# Patient Record
Sex: Female | Born: 1965
Health system: Southern US, Community
[De-identification: ages and names within clinical notes are randomized; demographics above are authoritative.]

## PROBLEM LIST (undated history)

## (undated) DIAGNOSIS — R51 Headache: Secondary | ICD-10-CM

## (undated) DIAGNOSIS — F32A Depression, unspecified: Secondary | ICD-10-CM

## (undated) DIAGNOSIS — F329 Major depressive disorder, single episode, unspecified: Secondary | ICD-10-CM

## (undated) DIAGNOSIS — N920 Excessive and frequent menstruation with regular cycle: Secondary | ICD-10-CM

## (undated) DIAGNOSIS — F419 Anxiety disorder, unspecified: Secondary | ICD-10-CM

## (undated) DIAGNOSIS — D649 Anemia, unspecified: Secondary | ICD-10-CM

## (undated) HISTORY — DX: Excessive and frequent menstruation with regular cycle: N92.0

---

## 1998-03-24 ENCOUNTER — Other Ambulatory Visit: Admission: RE | Admit: 1998-03-24 | Discharge: 1998-03-24 | Payer: Self-pay | Admitting: Obstetrics and Gynecology

## 1998-08-08 ENCOUNTER — Ambulatory Visit (HOSPITAL_COMMUNITY): Admission: RE | Admit: 1998-08-08 | Discharge: 1998-08-08 | Payer: Self-pay | Admitting: Obstetrics and Gynecology

## 1999-05-23 ENCOUNTER — Other Ambulatory Visit: Admission: RE | Admit: 1999-05-23 | Discharge: 1999-05-23 | Payer: Self-pay | Admitting: Obstetrics and Gynecology

## 1999-11-08 ENCOUNTER — Inpatient Hospital Stay (HOSPITAL_COMMUNITY): Admission: AD | Admit: 1999-11-08 | Discharge: 1999-11-10 | Payer: Self-pay | Admitting: Obstetrics and Gynecology

## 1999-11-16 ENCOUNTER — Encounter: Admission: RE | Admit: 1999-11-16 | Discharge: 1999-12-07 | Payer: Self-pay | Admitting: Obstetrics and Gynecology

## 1999-12-25 ENCOUNTER — Other Ambulatory Visit: Admission: RE | Admit: 1999-12-25 | Discharge: 1999-12-25 | Payer: Self-pay | Admitting: Obstetrics and Gynecology

## 2001-02-11 ENCOUNTER — Other Ambulatory Visit: Admission: RE | Admit: 2001-02-11 | Discharge: 2001-02-11 | Payer: Self-pay | Admitting: Obstetrics and Gynecology

## 2001-02-25 ENCOUNTER — Encounter: Admission: RE | Admit: 2001-02-25 | Discharge: 2001-02-25 | Payer: Self-pay | Admitting: Obstetrics and Gynecology

## 2001-02-25 ENCOUNTER — Encounter: Payer: Self-pay | Admitting: Obstetrics and Gynecology

## 2002-03-31 ENCOUNTER — Other Ambulatory Visit: Admission: RE | Admit: 2002-03-31 | Discharge: 2002-03-31 | Payer: Self-pay | Admitting: Obstetrics and Gynecology

## 2002-05-12 ENCOUNTER — Encounter: Payer: Self-pay | Admitting: Obstetrics and Gynecology

## 2002-05-12 ENCOUNTER — Encounter: Admission: RE | Admit: 2002-05-12 | Discharge: 2002-05-12 | Payer: Self-pay | Admitting: Obstetrics and Gynecology

## 2003-05-05 ENCOUNTER — Other Ambulatory Visit: Admission: RE | Admit: 2003-05-05 | Discharge: 2003-05-05 | Payer: Self-pay | Admitting: Obstetrics and Gynecology

## 2003-06-22 ENCOUNTER — Encounter: Admission: RE | Admit: 2003-06-22 | Discharge: 2003-06-22 | Payer: Self-pay | Admitting: Obstetrics and Gynecology

## 2004-08-22 ENCOUNTER — Encounter: Admission: RE | Admit: 2004-08-22 | Discharge: 2004-08-22 | Payer: Self-pay | Admitting: Obstetrics and Gynecology

## 2005-08-26 ENCOUNTER — Encounter: Admission: RE | Admit: 2005-08-26 | Discharge: 2005-08-26 | Payer: Self-pay | Admitting: Obstetrics and Gynecology

## 2006-05-14 ENCOUNTER — Other Ambulatory Visit: Admission: RE | Admit: 2006-05-14 | Discharge: 2006-05-14 | Payer: Self-pay | Admitting: Family Medicine

## 2006-09-11 ENCOUNTER — Encounter: Admission: RE | Admit: 2006-09-11 | Discharge: 2006-09-11 | Payer: Self-pay | Admitting: Family Medicine

## 2007-11-11 ENCOUNTER — Encounter: Admission: RE | Admit: 2007-11-11 | Discharge: 2007-11-11 | Payer: Self-pay | Admitting: Obstetrics and Gynecology

## 2008-11-11 ENCOUNTER — Encounter: Admission: RE | Admit: 2008-11-11 | Discharge: 2008-11-11 | Payer: Self-pay | Admitting: Family Medicine

## 2008-11-21 ENCOUNTER — Encounter: Admission: RE | Admit: 2008-11-21 | Discharge: 2008-11-21 | Payer: Self-pay | Admitting: Family Medicine

## 2009-11-27 ENCOUNTER — Encounter: Admission: RE | Admit: 2009-11-27 | Discharge: 2009-11-27 | Payer: Self-pay | Admitting: Family Medicine

## 2010-05-27 ENCOUNTER — Encounter: Payer: Self-pay | Admitting: Family Medicine

## 2010-09-21 NOTE — H&P (Signed)
[facility]  Patient:    Debra Caldwell, Debra Caldwell                   MRN: [id number] Attending:  Lenoard Aden, M.D.                         History and Physical  CHIEF COMPLAINT:              Favorable cervix at term, history of second trimester pregnancy loss.  HISTORY OF PRESENT ILLNESS:   Patient is a 45 year old white female, G7, P2-0-4-2, EDD November 19, 1999, at 38+ weeks, with advanced cervical dilatation and history of pregnancy loss.  ALLERGIES:                    No known drug allergies.  MEDICATIONS:                  Prenatal vitamins.  PAST MEDICAL HISTORY:         History of TAB in 1984, SAB in 1994, full-term vaginal delivery in 1995, SAB in 1996, full-term delivery in 1997 and 20-week pregnancy loss, unexplained, in 2000.  She has had fractured right wrist from a fall and wisdom tooth extraction previously.  No other medical or surgical hospitalizations.  FAMILY HISTORY:               Family history of cardiovascular disease, hypertension and thyroid disease.  PREGNANCY HISTORY:            Remarkable for normal prenatal lab data and a GBS that was negative.  PHYSICAL EXAMINATION:  GENERAL:                      She is a well-developed, well-nourished white female in no apparent distress.  HEENT:                        Normal.  LUNGS:                        Clear.  HEART:                        Regular rate and rhythm.  ABDOMEN:                      Soft, gravid, nontender.  Estimated fetal weight is 7 pounds.  PELVIC:                       Cervix is 4 cm, 50%, vertex -1.  EXTREMITIES:                  No cords.  NEUROLOGIC:                   Nonfocal.  IMPRESSION:                   1. Term intrauterine pregnancy.                               2. History of second trimester pregnancy loss.                               3. Grade 3 placenta.  PLAN:                         Admit.  Artificial rupture of membranes. Pitocin.   Anticipate vaginal delivery. DD:  11/08/99 TD:  11/08/99 Job: [id number]

## 2010-10-22 ENCOUNTER — Other Ambulatory Visit: Payer: Self-pay | Admitting: Family Medicine

## 2010-10-22 DIAGNOSIS — Z1231 Encounter for screening mammogram for malignant neoplasm of breast: Secondary | ICD-10-CM

## 2010-11-30 ENCOUNTER — Ambulatory Visit
Admission: RE | Admit: 2010-11-30 | Discharge: 2010-11-30 | Disposition: A | Discharge status: Home / Self Care | Payer: BC Managed Care – PPO | Source: Ambulatory Visit | Attending: Family Medicine | Admitting: Family Medicine

## 2010-11-30 DIAGNOSIS — Z1231 Encounter for screening mammogram for malignant neoplasm of breast: Secondary | ICD-10-CM

## 2011-11-04 ENCOUNTER — Other Ambulatory Visit: Payer: Self-pay | Admitting: Family Medicine

## 2011-11-04 DIAGNOSIS — Z1231 Encounter for screening mammogram for malignant neoplasm of breast: Secondary | ICD-10-CM

## 2011-11-25 LAB — HM PAP SMEAR: HM Pap smear: NEGATIVE

## 2011-12-02 ENCOUNTER — Ambulatory Visit
Admission: RE | Admit: 2011-12-02 | Discharge: 2011-12-02 | Disposition: A | Discharge status: Home / Self Care | Payer: BC Managed Care – PPO | Source: Ambulatory Visit | Attending: Family Medicine | Admitting: Family Medicine

## 2011-12-02 DIAGNOSIS — Z1231 Encounter for screening mammogram for malignant neoplasm of breast: Secondary | ICD-10-CM

## 2011-12-04 ENCOUNTER — Other Ambulatory Visit: Payer: Self-pay | Admitting: Family Medicine

## 2011-12-04 DIAGNOSIS — R928 Other abnormal and inconclusive findings on diagnostic imaging of breast: Secondary | ICD-10-CM

## 2011-12-11 ENCOUNTER — Ambulatory Visit
Admission: RE | Admit: 2011-12-11 | Discharge: 2011-12-11 | Disposition: A | Discharge status: Home / Self Care | Payer: BC Managed Care – PPO | Source: Ambulatory Visit | Attending: Family Medicine | Admitting: Family Medicine

## 2011-12-11 DIAGNOSIS — R928 Other abnormal and inconclusive findings on diagnostic imaging of breast: Secondary | ICD-10-CM

## 2011-12-11 IMAGING — MG MM DIGITAL DIAGNOSTIC LIMITED*L*
2 series · 2 of 2 positions shown · non-contrast
Comparison: Multiple annual examinations dating back to [4Q].

CLINICAL DATA: The patient returns for evaluation of a possible
asymmetry in the left breast noted on recent screening study dated
[DATE].  Her grandmother and three aunts had breast cancer.

[REDACTED] MAMMOGRAM  AND LEFT BREAST
ULTRASOUND:

[L CC]
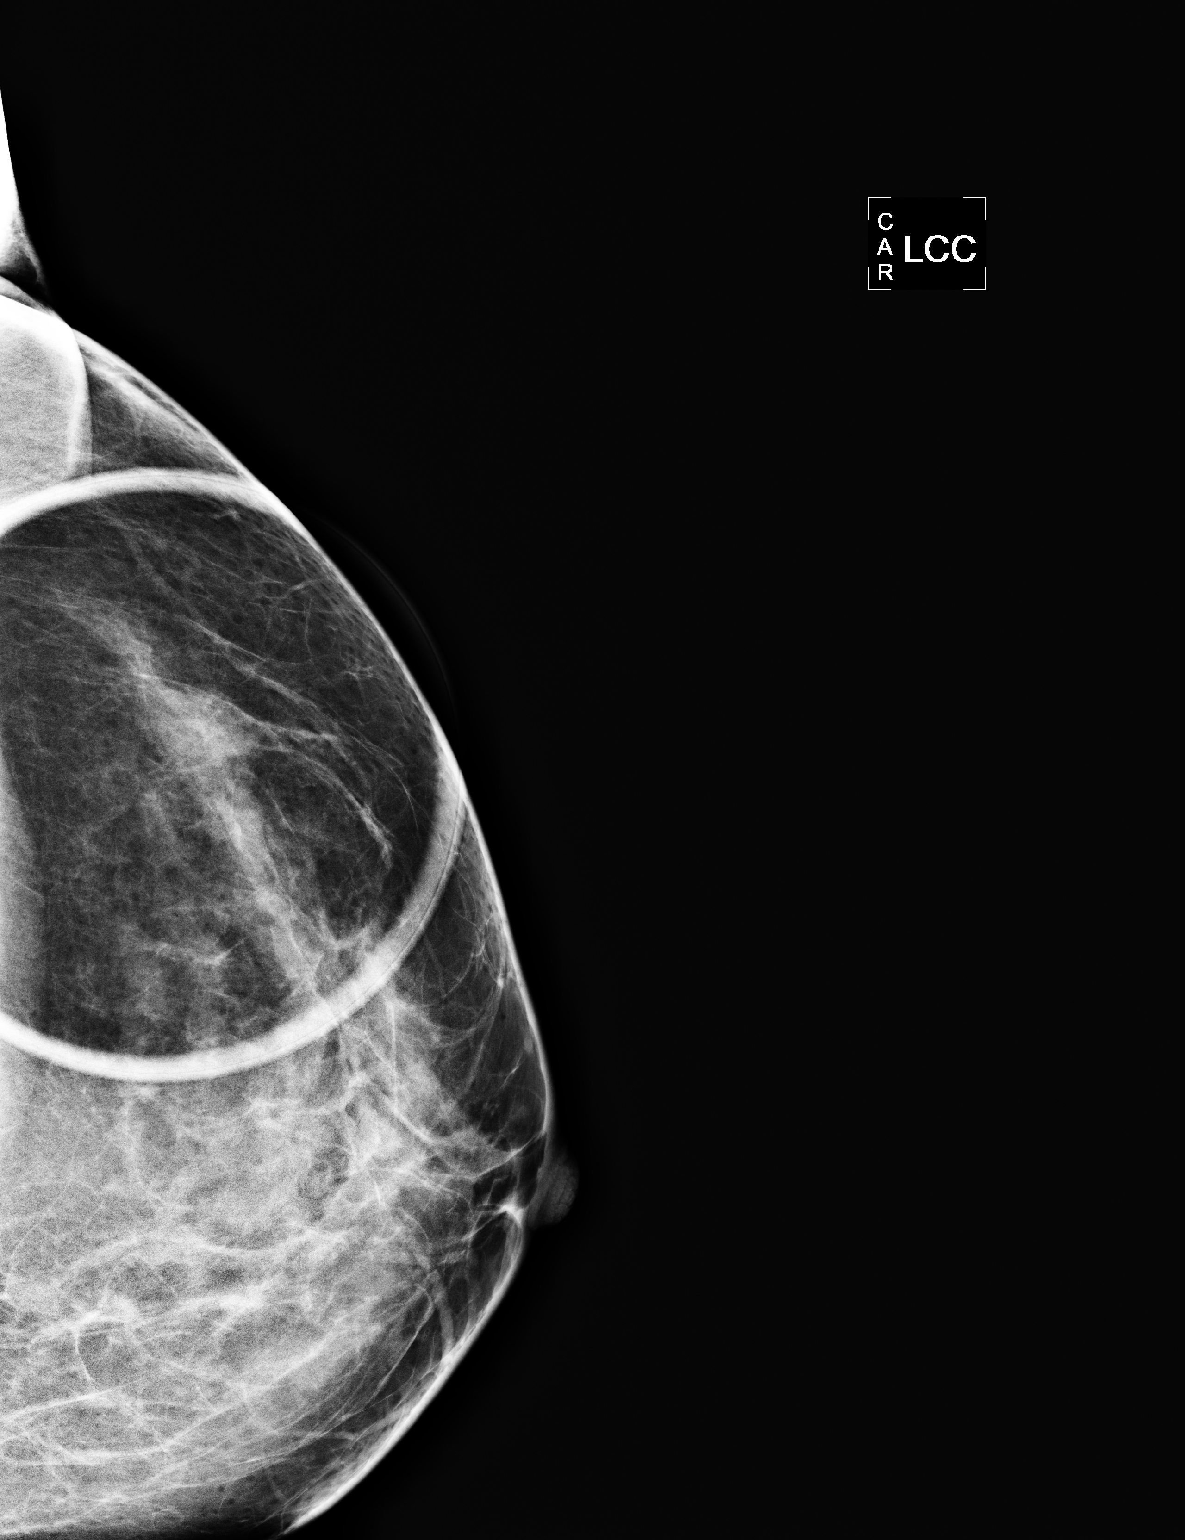

[L MLO]
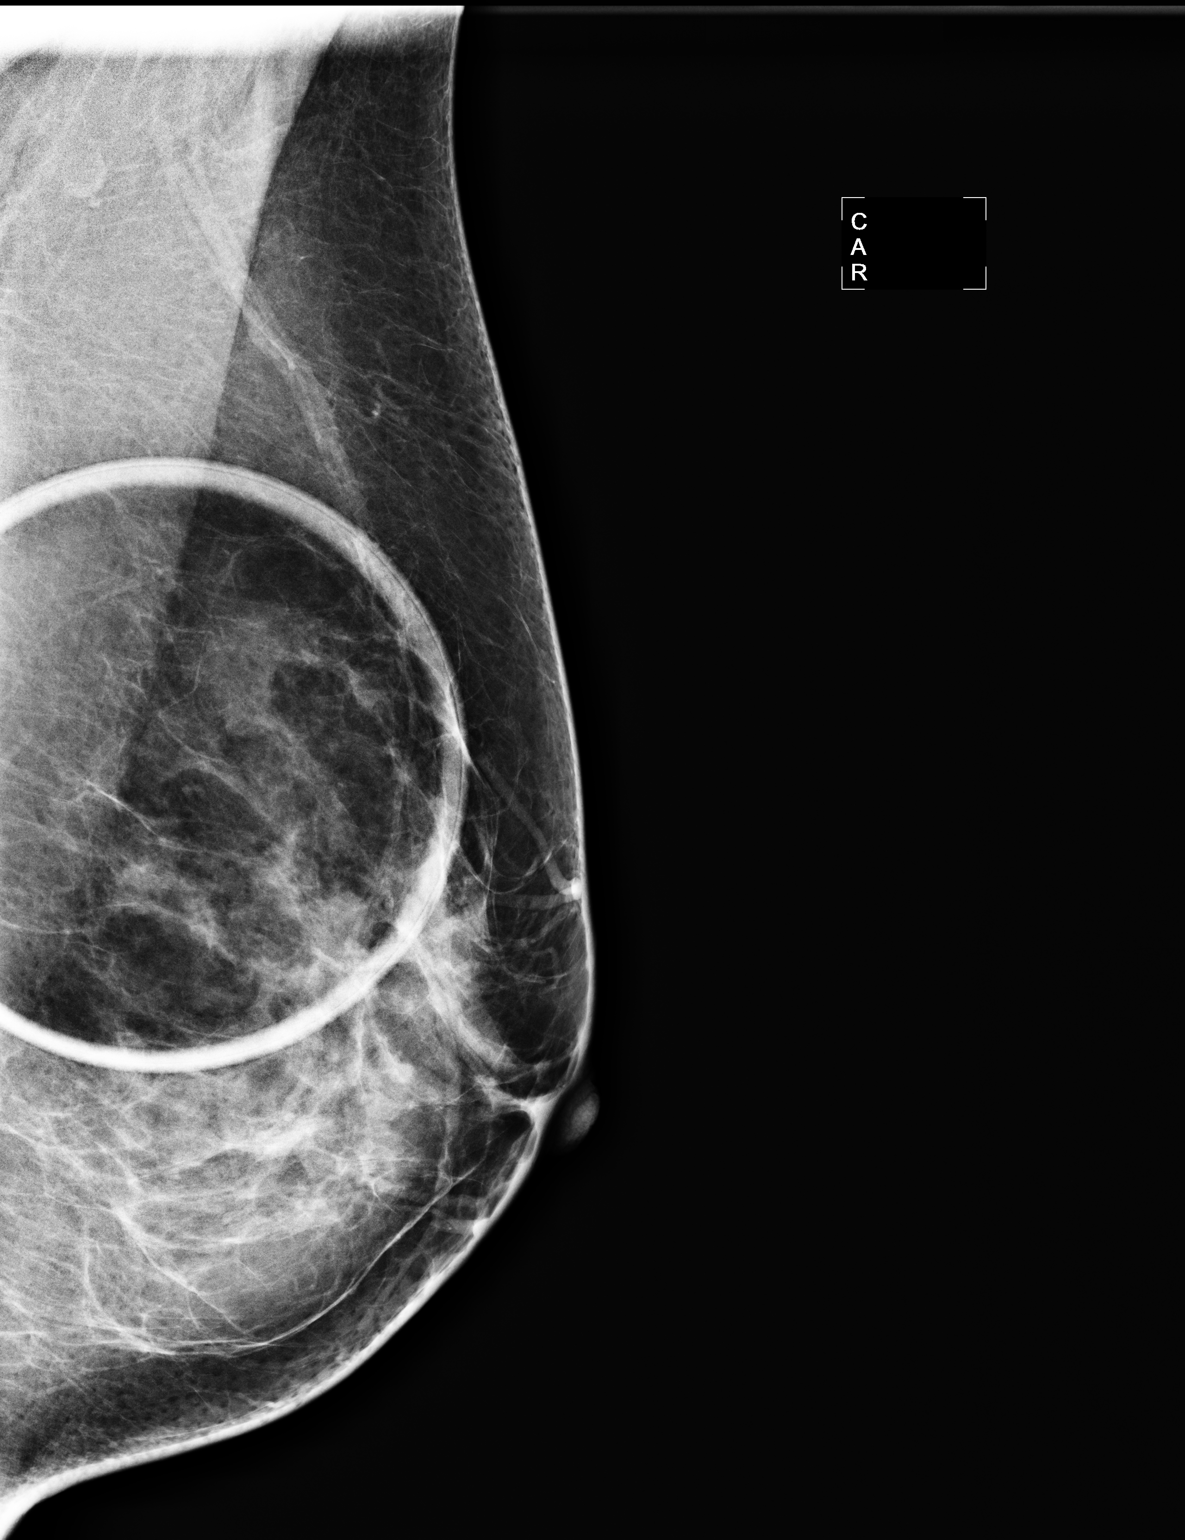

[2 of 2 positions shown; findings below may reference images not displayed]

FINDINGS: There are scattered fibroglandular densities.
Additional views demonstrate no persistent worrisome mass or
distortion in the left upper outer quadrant posteriorly.  The
parenchymal pattern is stable as compared to multiple prior
studies.

On physical exam, no mass is palpated in the outer portion of the
left breast.

Ultrasound is performed, showing mixed fibroglandular tissue and
fat in the outer portion of the left breast with no mass,
distortion or shadowing to suggest malignancy.
IMPRESSION: No persistent worrisome abnormality upon additional imaging of the
left breast.

RECOMMENDATION:
Yearly screening mammography is suggested.

BI-RADS CATEGORY 1:  Negative.

## 2012-04-13 ENCOUNTER — Encounter (HOSPITAL_COMMUNITY): Payer: Self-pay | Admitting: Pharmacist

## 2012-04-14 ENCOUNTER — Encounter (HOSPITAL_COMMUNITY): Payer: Self-pay

## 2012-04-14 ENCOUNTER — Encounter (HOSPITAL_COMMUNITY)
Admission: RE | Admit: 2012-04-14 | Discharge: 2012-04-14 | Disposition: A | Discharge status: Home / Self Care | Payer: BC Managed Care – PPO | Source: Ambulatory Visit | Attending: Obstetrics and Gynecology | Admitting: Obstetrics and Gynecology

## 2012-04-14 HISTORY — DX: Headache: R51

## 2012-04-14 HISTORY — DX: Major depressive disorder, single episode, unspecified: F32.9

## 2012-04-14 HISTORY — DX: Anxiety disorder, unspecified: F41.9

## 2012-04-14 HISTORY — DX: Depression, unspecified: F32.A

## 2012-04-14 HISTORY — DX: Anemia, unspecified: D64.9

## 2012-04-14 LAB — CBC
Hemoglobin: 12.1 g/dL (ref 12.0–15.0)
Platelets: 274 10*3/uL (ref 150–400)
RBC: 3.9 MIL/uL (ref 3.87–5.11)

## 2012-04-14 LAB — SURGICAL PCR SCREEN
MRSA, PCR: NEGATIVE
Staphylococcus aureus: POSITIVE — AB

## 2012-04-14 NOTE — Patient Instructions (Addendum)
Your procedure is scheduled on:04/21/12  Enter through the Main Entrance at :0830 am  Pick up desk phone and dial [phone] and inform us of your arrival.  Please call [phone] if you have any problems the morning of surgery.  Remember: Do not eat or drink after midnight:Monday   Take these meds the morning of surgery with a sip of water:none  DO NOT wear jewelry, eye make-up, lipstick,body lotion, or dark fingernail polish. Do not shave for 48 hours prior to surgery.  If you are to be admitted after surgery, leave suitcase in car until your room has been assigned.

## 2012-04-20 NOTE — H&P (Signed)
Debra Caldwell is an 46 y.o. female.   Chief Complaint: heavy bleeding HPI: $% yo Sep WF G7P3 with menorrhagia and anemia.  Uterus 8 x 5 x 5 cm on 12/03/11 with normal adnexa and nl SHSG.  Endo bx benign.    Past Medical History  Diagnosis Date  . Headache     migraines  . Anemia   . Anxiety   . Depression     situational    Past Surgical History  Procedure Date  . No past surgeries   D & C x 4  No family history on file. Social History:  reports that she has never smoked. She does not have any smokeless tobacco history on file. 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 No results found.  Review of Systems  All other systems reviewed and are negative.    Height 5\' 2"  (1.575 m), weight 54.432 kg (120 lb). Physical Exam  Constitutional: She is oriented to person, place, and time. She appears well-developed and well-nourished.  HENT:  Head: Normocephalic and atraumatic.  Eyes: Conjunctivae normal are normal.  Neck: Normal range of motion. Neck supple. No thyromegaly present.  Cardiovascular: Normal rate and regular rhythm.   Respiratory: Effort normal and breath sounds normal.  GI: Soft. Bowel sounds are normal. She exhibits no distension. There is no tenderness. There is no rebound.  Genitourinary: Vagina normal and uterus normal.  Musculoskeletal: Normal range of motion.  Neurological: She is alert and oriented to person, place, and time.  Skin: Skin is warm and dry.  Psychiatric: She has a normal mood and affect. Her behavior is normal. Thought content normal.     Assessment/Plan Menorrhagia, anemia.  For LAVH, bilat salpingectomy Christean Silvestri P 04/20/2012, 9:02 PM

## 2012-04-21 ENCOUNTER — Encounter (HOSPITAL_COMMUNITY): Payer: Self-pay | Admitting: Anesthesiology

## 2012-04-21 ENCOUNTER — Encounter (HOSPITAL_COMMUNITY): Payer: Self-pay

## 2012-04-21 ENCOUNTER — Ambulatory Visit (HOSPITAL_COMMUNITY): Payer: BC Managed Care – PPO | Admitting: Anesthesiology

## 2012-04-21 ENCOUNTER — Encounter (HOSPITAL_COMMUNITY)
Admission: RE | Disposition: A | Discharge status: Home / Self Care | Payer: Self-pay | Source: Ambulatory Visit | Attending: Obstetrics and Gynecology

## 2012-04-21 ENCOUNTER — Ambulatory Visit (HOSPITAL_COMMUNITY)
Admission: RE | Admit: 2012-04-21 | Discharge: 2012-04-22 | Disposition: A | Discharge status: Home / Self Care | Payer: BC Managed Care – PPO | Source: Ambulatory Visit | Attending: Obstetrics and Gynecology | Admitting: Obstetrics and Gynecology

## 2012-04-21 DIAGNOSIS — D649 Anemia, unspecified: Secondary | ICD-10-CM | POA: Insufficient documentation

## 2012-04-21 DIAGNOSIS — N920 Excessive and frequent menstruation with regular cycle: Secondary | ICD-10-CM | POA: Insufficient documentation

## 2012-04-21 HISTORY — PX: LAPAROSCOPIC ASSISTED VAGINAL HYSTERECTOMY: SHX5398

## 2012-04-21 HISTORY — PX: BILATERAL SALPINGECTOMY: SHX5743

## 2012-04-21 LAB — HCG, SERUM, QUALITATIVE: Preg, Serum: NEGATIVE

## 2012-04-21 SURGERY — HYSTERECTOMY, VAGINAL, LAPAROSCOPY-ASSISTED
Anesthesia: General | Site: Abdomen | Wound class: Clean Contaminated

## 2012-04-21 MED ORDER — HYDROMORPHONE HCL PF 1 MG/ML IJ SOLN
INTRAMUSCULAR | Status: DC | PRN
  Administered 2012-04-21: 1 mg via INTRAVENOUS

## 2012-04-21 MED ORDER — MIDAZOLAM HCL 5 MG/5ML IJ SOLN
INTRAMUSCULAR | Status: DC | PRN
  Administered 2012-04-21: 2 mg via INTRAVENOUS

## 2012-04-21 MED ORDER — METOCLOPRAMIDE HCL 5 MG/ML IJ SOLN: 10.0000 mg | Freq: Once | INTRAMUSCULAR | Status: DC | PRN

## 2012-04-21 MED ORDER — HYDROMORPHONE HCL PF 1 MG/ML IJ SOLN
INTRAMUSCULAR | Status: AC
  Filled 2012-04-21: qty 1

## 2012-04-21 MED ORDER — SODIUM CHLORIDE 0.9 % IJ SOLN
INTRAMUSCULAR | Status: DC | PRN
  Administered 2012-04-21: 10 mL

## 2012-04-21 MED ORDER — ROPIVACAINE HCL 5 MG/ML IJ SOLN
INTRAMUSCULAR | Status: DC | PRN
  Administered 2012-04-21: 30 mL

## 2012-04-21 MED ORDER — LACTATED RINGERS IV SOLN
INTRAVENOUS | Status: DC
  Administered 2012-04-21: 13:00:00 via INTRAVENOUS
  Administered 2012-04-21: 125 mL/h via INTRAVENOUS
  Administered 2012-04-21 (×3): via INTRAVENOUS

## 2012-04-21 MED ORDER — NEOSTIGMINE METHYLSULFATE 1 MG/ML IJ SOLN
INTRAMUSCULAR | Status: DC | PRN
  Administered 2012-04-21: 1 mg via INTRAVENOUS

## 2012-04-21 MED ORDER — DEXAMETHASONE SODIUM PHOSPHATE 10 MG/ML IJ SOLN
INTRAMUSCULAR | Status: AC
  Filled 2012-04-21: qty 1

## 2012-04-21 MED ORDER — FENTANYL CITRATE 0.05 MG/ML IJ SOLN
INTRAMUSCULAR | Status: DC | PRN
  Administered 2012-04-21 (×2): 50 ug via INTRAVENOUS
  Administered 2012-04-21: 100 ug via INTRAVENOUS
  Administered 2012-04-21: 50 ug via INTRAVENOUS

## 2012-04-21 MED ORDER — GLYCOPYRROLATE 0.2 MG/ML IJ SOLN
INTRAMUSCULAR | Status: DC | PRN
  Administered 2012-04-21: 0.2 mg via INTRAVENOUS

## 2012-04-21 MED ORDER — DEXAMETHASONE SODIUM PHOSPHATE 10 MG/ML IJ SOLN
INTRAMUSCULAR | Status: DC | PRN
  Administered 2012-04-21: 10 mg via INTRAVENOUS

## 2012-04-21 MED ORDER — EPHEDRINE SULFATE 50 MG/ML IJ SOLN
INTRAMUSCULAR | Status: DC | PRN
  Administered 2012-04-21 (×3): 10 mg via INTRAVENOUS

## 2012-04-21 MED ORDER — FENTANYL CITRATE 0.05 MG/ML IJ SOLN: 25.0000 ug | INTRAMUSCULAR | Status: DC | PRN

## 2012-04-21 MED ORDER — ONDANSETRON HCL 4 MG/2ML IJ SOLN
4.0000 mg | Freq: Four times a day (QID) | INTRAMUSCULAR | Status: DC | PRN
  Administered 2012-04-21: 4 mg via INTRAVENOUS
  Filled 2012-04-21: qty 2

## 2012-04-21 MED ORDER — NEOSTIGMINE METHYLSULFATE 1 MG/ML IJ SOLN
INTRAMUSCULAR | Status: AC
  Filled 2012-04-21: qty 1

## 2012-04-21 MED ORDER — OXYCODONE-ACETAMINOPHEN 5-325 MG PO TABS
1.0000 | ORAL_TABLET | ORAL | Status: DC | PRN
  Administered 2012-04-22: 2 via ORAL
  Administered 2012-04-22: 1 via ORAL
  Filled 2012-04-21 (×3): qty 1

## 2012-04-21 MED ORDER — DEXTROSE IN LACTATED RINGERS 5 % IV SOLN
INTRAVENOUS | Status: DC
  Administered 2012-04-21 – 2012-04-22 (×2): via INTRAVENOUS

## 2012-04-21 MED ORDER — EPHEDRINE 5 MG/ML INJ
INTRAVENOUS | Status: AC
  Filled 2012-04-21: qty 10

## 2012-04-21 MED ORDER — ONDANSETRON HCL 4 MG PO TABS: 4.0000 mg | ORAL_TABLET | Freq: Four times a day (QID) | ORAL | Status: DC | PRN

## 2012-04-21 MED ORDER — ROCURONIUM BROMIDE 100 MG/10ML IV SOLN
INTRAVENOUS | Status: DC | PRN
  Administered 2012-04-21: 40 mg via INTRAVENOUS
  Administered 2012-04-21: 10 mg via INTRAVENOUS

## 2012-04-21 MED ORDER — ROCURONIUM BROMIDE 50 MG/5ML IV SOLN
INTRAVENOUS | Status: AC
  Filled 2012-04-21: qty 1

## 2012-04-21 MED ORDER — LIDOCAINE HCL (CARDIAC) 20 MG/ML IV SOLN
INTRAVENOUS | Status: AC
  Filled 2012-04-21: qty 5

## 2012-04-21 MED ORDER — LIDOCAINE-EPINEPHRINE 1 %-1:100000 IJ SOLN
INTRAMUSCULAR | Status: DC | PRN
  Administered 2012-04-21: 10 mL

## 2012-04-21 MED ORDER — SCOPOLAMINE 1 MG/3DAYS TD PT72
MEDICATED_PATCH | TRANSDERMAL | Status: AC
  Administered 2012-04-21: 1.5 mg via TRANSDERMAL
  Filled 2012-04-21: qty 1

## 2012-04-21 MED ORDER — GLYCOPYRROLATE 0.2 MG/ML IJ SOLN
INTRAMUSCULAR | Status: AC
  Filled 2012-04-21: qty 1

## 2012-04-21 MED ORDER — PROPOFOL 10 MG/ML IV EMUL
INTRAVENOUS | Status: DC | PRN
  Administered 2012-04-21: 180 mg via INTRAVENOUS

## 2012-04-21 MED ORDER — PROPOFOL 10 MG/ML IV EMUL
INTRAVENOUS | Status: AC
  Filled 2012-04-21: qty 20

## 2012-04-21 MED ORDER — SCOPOLAMINE 1 MG/3DAYS TD PT72SCOPOLAMINE 1 MG/3DAYS
1.0000 | MEDICATED_PATCH | TRANSDERMAL | Status: DC
Start: 2012-04-21 — End: 2012-04-21
  Administered 2012-04-21: 1.5 mg via TRANSDERMAL

## 2012-04-21 MED ORDER — ROPIVACAINE HCL 5 MG/ML IJ SOLN
INTRAMUSCULAR | Status: AC
  Filled 2012-04-21: qty 30

## 2012-04-21 MED ORDER — LIDOCAINE HCL (CARDIAC) 20 MG/ML IV SOLN
INTRAVENOUS | Status: DC | PRN
  Administered 2012-04-21: 50 mg via INTRAVENOUS

## 2012-04-21 MED ORDER — FENTANYL CITRATE 0.05 MG/ML IJ SOLN
INTRAMUSCULAR | Status: AC
  Filled 2012-04-21: qty 5

## 2012-04-21 MED ORDER — MIDAZOLAM HCL 2 MG/2ML IJ SOLN
INTRAMUSCULAR | Status: AC
  Filled 2012-04-21: qty 2

## 2012-04-21 MED ORDER — ONDANSETRON HCL 4 MG/2ML IJ SOLN
INTRAMUSCULAR | Status: DC | PRN
  Administered 2012-04-21: 4 mg via INTRAVENOUS

## 2012-04-21 MED ORDER — MEPERIDINE HCL 25 MG/ML IJ SOLN: 6.2500 mg | INTRAMUSCULAR | Status: DC | PRN

## 2012-04-21 MED ORDER — DEXTROSE 5 % IV SOLN
2.0000 g | INTRAVENOUS | Status: AC
  Administered 2012-04-21: 2 g via INTRAVENOUS
  Filled 2012-04-21: qty 2

## 2012-04-21 MED ORDER — ONDANSETRON HCL 4 MG/2ML IJ SOLN
INTRAMUSCULAR | Status: AC
  Filled 2012-04-21: qty 2

## 2012-04-21 MED ORDER — MORPHINE SULFATE 4 MG/ML IJ SOLN
1.0000 mg | INTRAMUSCULAR | Status: DC | PRN
  Administered 2012-04-21 (×2): 2 mg via INTRAVENOUS
  Filled 2012-04-21 (×2): qty 1

## 2012-04-21 SURGICAL SUPPLY — 28 items
CABLE HIGH FREQUENCY MONO STRZ (ELECTRODE) ×3 IMPLANT
CONT PATH 16OZ SNAP LID 3702 (MISCELLANEOUS) ×3 IMPLANT
COVER TABLE BACK 60X90 (DRAPES) ×3 IMPLANT
DECANTER SPIKE VIAL GLASS SM (MISCELLANEOUS) ×6 IMPLANT
DERMABOND ADVANCED (GAUZE/BANDAGES/DRESSINGS) ×1
DERMABOND ADVANCED .7 DNX12 (GAUZE/BANDAGES/DRESSINGS) ×2 IMPLANT
ELECT REM PT RETURN 9FT ADLT (ELECTROSURGICAL) ×3
ELECTRODE REM PT RTRN 9FT ADLT (ELECTROSURGICAL) ×2 IMPLANT
EVACUATOR SMOKE 8.L (FILTER) ×3 IMPLANT
GLOVE BIOGEL PI IND STRL 7.0 (GLOVE) ×6 IMPLANT
GLOVE BIOGEL PI INDICATOR 7.0 (GLOVE) ×3
GLOVE ECLIPSE 6.5 STRL STRAW (GLOVE) ×6 IMPLANT
GOWN STRL REIN XL XLG (GOWN DISPOSABLE) ×12 IMPLANT
NS IRRIG 1000ML POUR BTL (IV SOLUTION) ×3 IMPLANT
PACK LAVH (CUSTOM PROCEDURE TRAY) ×3 IMPLANT
PROTECTOR NERVE ULNAR (MISCELLANEOUS) ×6 IMPLANT
SCISSORS LAP 5X35 DISP (ENDOMECHANICALS) ×3 IMPLANT
SET IRRIG TUBING LAPAROSCOPIC (IRRIGATION / IRRIGATOR) IMPLANT
SUT VIC AB 0 CT1 18XCR BRD8 (SUTURE) ×4 IMPLANT
SUT VIC AB 0 CT1 36 (SUTURE) ×6 IMPLANT
SUT VIC AB 0 CT1 8-18 (SUTURE) ×2
SUT VICRYL 0 UR6 27IN ABS (SUTURE) ×3 IMPLANT
TOWEL OR 17X24 6PK STRL BLUE (TOWEL DISPOSABLE) ×6 IMPLANT
TRAY FOLEY CATH 14FR (SET/KITS/TRAYS/PACK) ×3 IMPLANT
TROCAR XCEL NON-BLD 11X100MML (ENDOMECHANICALS) ×3 IMPLANT
TROCAR XCEL NON-BLD 5MMX100MML (ENDOMECHANICALS) ×6 IMPLANT
WARMER LAPAROSCOPE (MISCELLANEOUS) ×3 IMPLANT
WATER STERILE IRR 1000ML POUR (IV SOLUTION) ×3 IMPLANT

## 2012-04-21 NOTE — Op Note (Signed)
Preoperative diagnosis: Menorrhagia, anemia Postoperative diagnosis: Same, path pending Procedure: Laparoscopically-assisted total vaginal hysterectomy and bilateral salpingectomy Surgeon: Dr. Meredeth Ide assistant: Dr. Douglass Rivers anesthesia: Gen. endotracheal Estimated blood loss 100 cc Complications: None Procedure: The patient was taken to the operating room and after induction of adequate general endotracheal anesthesia was placed in the low dorsolithotomy position and prepped and draped in usual fashion.  A foley catheter was placed.  The bivalve speculum was placed and the cervix was grasped on its anterior lip single-tooth tenaculum.  A Hulka uterine manipulator was placed.  Attention was next turned to the abdomen.  A natural crease in the umbilicus was infiltrated with 1% Xylocaine incised with a knife and a varies needle was inserted into the peritoneal space.  Proper placement was noted using the hanging drop technique.  Pneumoperitoneum was created with 2 and half liters of CO2 using the automatic insufflator.  A to 11 mm Optiview trocar was then inserted.  It was found to be preperitoneal in its placement and there was some subcutaneous gas, therefore the trocar was removed, the gas was allowed to escape from the subcutaneous space.  And the varies needle was reinserted.  Again its placement was tested with the hanging drop technique.  Pneumoperitoneum was then created again with 2 and half liters of CO2.  Upon placement of the 1011 mm Optiview scope intraperitoneal placement was noted.  The abdominal wall was transilluminated, the sites for the 5 mm ports were infiltrated with 1% Xylocaine, incised with a knife, and were inserted under direct visualization the pelvis was inspected.  The uterus was of a normal size and shape.  The anterior posterior cul-de-sacs were clear.  The tubes and ovaries appeared normal.  The appendix was seen and appeared normal.  The liver and gallbladder and  stomach were also normal.  The ureters were identified and seen peristalsing bilaterally.  The procedure began on the patient's left, separating the fimbriated end of the tube from the ovary using the gyrus, and then separating mesosalpinx sequentially until the cornu was reached.  The utero-ovarian ligament was coagulated multiple times and cut.  The round ligament was coagulated and cut.  The anterior leaf of the broad ligament was taken down with monopolar scissors.  Attention was then turned to the patient's right.  The utero-ovarian ligament was coagulated multiple times and cut.  The tube was separated from the ovary its fimbriated end using the gyrus and then sequentially was separated off the mesosalpinx using gyrus.  The round ligament was coagulated and cut.  The anterior leaf of the broad ligament was taken down sharply with monopolar scissors and connected to the incision from the other side.  This was then removed from the abdomen and the procedure was begun vaginally.  A posterior weighted and anterior Deaver retractor were placed.  The cervix was grasped on its anterior lip with a single-tooth tenaculum.  The mucosa over the cervix was infiltrated with 1% Xylocaine with epinephrine.  A knife was used to incise mucosa from 9 to 3:00.  Mayo scissors were used to sharply dissect the bladder off the cervix, and then blunt dissection was also used to assist in the dissection.  Attention was then turned posteriorly.  A posterior colpotomy incision was made, and a banana retractor was placed.  The uterosacral ligaments were clamped with Heaneys cut and tied with 0 Vicryl.  One more bite was taken on each cardinal ligament on either side.  Then the attention was  turned back anteriorly.  Very gentle blunt dissection the peritoneum was entered.  A Deaver retractor was placed into the peritoneal space.  The uterine arteries on either side were then clamped with a Haney clamp cut and doubly tied.  The remainder  of the attachments of the uterus in the broad ligament were clamped cut and tied.  The specimen was then removed and sent to pathology.  The specimen was uterus cervix and bilateral tubes.  The pedicles were inspected and were free of bleeding.  The vaginal cuff was then run with a 0 Vicryl stitch from 2:00 to 10:00 incorporating the vaginal cuff and the peritoneum.  A McCall stitch was then placed bringing the uterosacral ligaments together in the midline.  Diet was then irrigated and was felt to be hemostatic.  The vaginal cuff was then closed with 4 at the suture of figure-of-eight 0 Vicryls.  Irrigation was done again.  In the vaginal portion of the procedure was terminated.  Attention was turned back abdominally.  The pneumoperitoneum was recreated and the scope was used to examine the pelvis.  The pelvis was irrigated and was felt to be hemostatic.  The pressure in the abdomen was decreased to approximately 8 mm of mercury and the pedicles were still felt to be hemostatic.  Instrument removed from the abdomen.  Pneumoperitoneum was allowed to escape.  The trocar sleeves were removed.  The fascia at the umbilical incision was closed with a single figure-of-eight suture of 0 Vicryl.  Skin at the umbilicus was closed with a subcuticular 4-0 Rapide.  All the incisions were closed with Dermabond.  The procedure was terminated.  Sponge needle and instrument counts were correct x3.  The patient was taken to the recovery room in satisfactory condition.

## 2012-04-21 NOTE — Anesthesia Postprocedure Evaluation (Signed)
Anesthesia Post Note  Patient: Debra Caldwell  Procedure(s) Performed: Procedure(s) (LRB): LAPAROSCOPIC ASSISTED VAGINAL HYSTERECTOMY (N/A) BILATERAL SALPINGECTOMY (Bilateral)  Anesthesia type: General  Patient location: PACU  Post pain: Pain level controlled  Post assessment: Post-op Vital signs reviewed  Last Vitals:  Filed Vitals:   04/21/12 1445  BP: 109/53  Pulse: 87  Temp:   Resp: 19    Post vital signs: Reviewed  Level of consciousness: sedated  Complications: No apparent anesthesia complicationsfj

## 2012-04-21 NOTE — Interval H&P Note (Signed)
History and Physical Interval Note:  04/21/2012 10:51 AM  Debra Caldwell  has presented today for surgery, with the diagnosis of menorrhagia, anemia  The various methods of treatment have been discussed with the patient and family. After consideration of risks, benefits and other options for treatment, the patient has consented to  Procedure(s) (LRB) with comments: LAPAROSCOPIC ASSISTED VAGINAL HYSTERECTOMY (N/A) as a surgical intervention .  The patient's history has been reviewed, patient examined, no change in status, stable for surgery.  I have reviewed the patient's chart and labs.  Questions were answered to the patient's satisfaction.     ROMINE,CYNTHIA P

## 2012-04-21 NOTE — Transfer of Care (Signed)
Immediate Anesthesia Transfer of Care Note  Patient: Debra Caldwell  Procedure(s) Performed: Procedure(s) (LRB) with comments: LAPAROSCOPIC ASSISTED VAGINAL HYSTERECTOMY (N/A) BILATERAL SALPINGECTOMY (Bilateral)  Patient Location: PACU  Anesthesia Type:General  Level of Consciousness: sedated  Airway & Oxygen Therapy: Patient Spontanous Breathing and Patient connected to nasal cannula oxygen  Post-op Assessment: Report given to PACU RN and Post -op Vital signs reviewed and stable  Post vital signs: Reviewed and stable  Complications: No apparent anesthesia complications

## 2012-04-21 NOTE — OR Nursing (Signed)
Went [phone]

## 2012-04-21 NOTE — Anesthesia Preprocedure Evaluation (Signed)
Anesthesia Evaluation  Patient identified by MRN, date of birth, ID band Patient awake    Reviewed: Allergy & Precautions, H&P , NPO status , Patient's Chart, lab work & pertinent test results  Airway Mallampati: III TM Distance: >3 FB Neck ROM: Full    Dental No notable dental hx. (+) Teeth Intact   Pulmonary neg pulmonary ROS,    Pulmonary exam normal       Cardiovascular negative cardio ROS  Rhythm:Regular Rate:Normal     Neuro/Psych  Headaches, PSYCHIATRIC DISORDERS Anxiety Depression    GI/Hepatic negative GI ROS, Neg liver ROS,   Endo/Other  negative endocrine ROS  Renal/GU negative Renal ROS  negative genitourinary   Musculoskeletal negative musculoskeletal ROS (+)   Abdominal Normal abdominal exam  (+)   Peds  Hematology  (+) Blood dyscrasia, anemia ,   Anesthesia Other Findings   Reproductive/Obstetrics negative OB ROS Menorrhagia                            Anesthesia Physical Anesthesia Plan  ASA: II  Anesthesia Plan: General   Post-op Pain Management:    Induction: Intravenous  Airway Management Planned: Oral ETT  Additional Equipment:   Intra-op Plan:   Post-operative Plan: Extubation in OR  Informed Consent: I have reviewed the patients History and Physical, chart, labs and discussed the procedure including the risks, benefits and alternatives for the proposed anesthesia with the patient or authorized representative who has indicated his/her understanding and acceptance.   Dental advisory given  Plan Discussed with: CRNA, Anesthesiologist and Surgeon  Anesthesia Plan Comments:         Anesthesia Quick Evaluation

## 2012-04-22 ENCOUNTER — Encounter (HOSPITAL_COMMUNITY): Payer: Self-pay | Admitting: Obstetrics and Gynecology

## 2012-04-22 NOTE — Progress Notes (Signed)
POD #1  Pt up sitting in chair, smiling and animated.  VSS, afebrile.  Reports had trouble with foley not draining well through the night and she had to stand up and hold the tubing in a certain way to get the urine to drain - otherwise she was uncomfortable with a full bladder.  This am, there is a large amount of dilute urine in the foley bag. Pain controlled well with po percocet.  No nausea.  Eager to go home.  Findings at surgery reviewed.  Exam:  VSS, afeb.  Abd soft nt.  Incisions intact.  No vag bleeding.  Extrem neg.  Ready for D/C.  Instructed.  Has rx for percocet at home and also celebrex.  Post op appt made.  Questions answered.

## 2012-04-22 NOTE — Addendum Note (Signed)
Addendum  created 04/22/12 0830 by Algis Greenhouse, CRNA   Modules edited:Notes Section

## 2012-04-22 NOTE — Progress Notes (Signed)
Patient discharged home.  Patient ambulated to car without difficulty. Patient verbalized understanding of discharge instructions

## 2012-04-22 NOTE — Anesthesia Postprocedure Evaluation (Signed)
Anesthesia Post Note  Patient: Debra Caldwell  Procedure(s) Performed: Procedure(s) (LRB): LAPAROSCOPIC ASSISTED VAGINAL HYSTERECTOMY (N/A) BILATERAL SALPINGECTOMY (Bilateral)  Anesthesia type: General  Patient location: [facility]  Post pain: Pain level controlled  Post assessment: Post-op Vital signs reviewed  Last Vitals:  Filed Vitals:   04/22/12 0630  BP: 99/49  Pulse: 77  Temp: 36.7 C  Resp: 17    Post vital signs: Reviewed  Level of consciousness: sedated  Complications: No apparent anesthesia complications

## 2012-11-10 ENCOUNTER — Other Ambulatory Visit: Payer: Self-pay

## 2012-11-10 DIAGNOSIS — Z1231 Encounter for screening mammogram for malignant neoplasm of breast: Secondary | ICD-10-CM

## 2012-12-03 ENCOUNTER — Encounter: Payer: Self-pay | Admitting: *Deleted

## 2012-12-08 ENCOUNTER — Encounter: Payer: Self-pay | Admitting: Nurse Practitioner

## 2012-12-08 ENCOUNTER — Ambulatory Visit (INDEPENDENT_AMBULATORY_CARE_PROVIDER_SITE_OTHER): Payer: BC Managed Care – PPO | Admitting: Nurse Practitioner

## 2012-12-08 VITALS — BP 100/60 | HR 72 | Ht 62.0 in | Wt 119.0 lb

## 2012-12-08 DIAGNOSIS — Z01419 Encounter for gynecological examination (general) (routine) without abnormal findings: Secondary | ICD-10-CM

## 2012-12-08 DIAGNOSIS — Z0000 Encounter for general adult medical examination without abnormal findings: Secondary | ICD-10-CM

## 2012-12-08 NOTE — Progress Notes (Signed)
Patient ID: Debra Caldwell, female   DOB: May 16, 1965, 47 y.o.   MRN: [id number] 47 y.o. [id number] Separated [place] Fe here for annual exam.  LAVH with salpingectomy and left both ovaries. 04/21/12.  Since then doing very well. Some vaso symptoms worse at night . So far tolerable. Still with headaches but not as severe. Some may be weather related. Not dating or sexually active.  Patient's last menstrual period was 04/05/2012.          Sexually active: no  The current method of family planning is abstinence.    Exercising: yes  jogging 3 times per week Smoker:  no  Health Maintenance: Pap:  11/25/11, WNL, neg HR HPV MMG:  12/11/11, BI-Rads 1 negative scheduled 12/09/12 Colonoscopy:  2011, negative repeat in 10 years BMD:   never TDaP:  ?2003 Labs: done at PCP   reports that she has never smoked. She does not have any smokeless tobacco history on file. She reports that she does not drink alcohol or use illicit drugs.  Past Medical History  Diagnosis Date  . Headache(784.0)     migraines  . Anemia   . Anxiety   . Depression     situational  . Menorrhagia     Past Surgical History  Procedure Laterality Date  . No past surgeries    . Laparoscopic assisted vaginal hysterectomy  04/21/2012    Procedure: LAPAROSCOPIC ASSISTED VAGINAL HYSTERECTOMY;  Surgeon: Alison Murray, MD;  Location: [facility];  Service: Gynecology;  Laterality: N/A;  . Bilateral salpingectomy  04/21/2012    Procedure: BILATERAL SALPINGECTOMY;  Surgeon: Alison Murray, MD;  Location: [facility];  Service: Gynecology;  Laterality: Bilateral;  . Vaginal hysterectomy  04/21/2012  . Vaginal delivery      x3    Current Outpatient Prescriptions  Medication Sig Dispense Refill  . rizatriptan (MAXALT) 10 MG tablet Take 10 mg by mouth as needed for migraine. May repeat in 2 hours if needed      . topiramate (TOPAMAX) 25 MG tablet Take 75 mg by mouth daily.       No current facility-administered  medications for this visit.    Family History  Problem Relation Age of Onset  . Breast cancer Maternal Aunt   . Breast cancer Maternal Grandmother   . Breast cancer Cousin     ROS:  Pertinent items are noted in HPI.  Otherwise, a comprehensive ROS was negative.  Exam:   BP 100/60  Pulse 72  Ht 5\' 2"  (1.575 m)  Wt 119 lb (53.978 kg)  BMI 21.76 kg/m2  LMP 04/05/2012 Height: 5\' 2"  (157.5 cm)  Ht Readings from Last 3 Encounters:  12/08/12 5\' 2"  (1.575 m)  04/03/12 5\' 2"  (1.575 m)  04/03/12 5\' 2"  (1.575 m)    General appearance: alert, cooperative and appears stated age Head: Normocephalic, without obvious abnormality, atraumatic Neck: no adenopathy, supple, symmetrical, trachea midline and thyroid normal to inspection and palpation Lungs: clear to auscultation bilaterally Breasts: normal appearance, no masses or tenderness Heart: regular rate and rhythm Abdomen: soft, non-tender; no masses,  no organomegaly Extremities: extremities normal, atraumatic, no cyanosis or edema Skin: Skin color, texture, turgor normal. No rashes or lesions Lymph nodes: Cervical, supraclavicular, and axillary nodes normal. No abnormal inguinal nodes palpated Neurologic: Grossly normal   Pelvic: External genitalia:  no lesions              Urethra:  normal appearing urethra with no masses, tenderness or  lesions              Bartholin's and Skene's: normal                 Vagina: normal appearing vagina with normal color and discharge, no lesions              Cervix: absent              Pap taken: no Bimanual Exam:  Uterus:  uterus absent              Adnexa: no mass, fullness, tenderness               Rectovaginal: Confirms               Anus:  normal sphincter tone, no lesions  A:  Well Woman with normal exam  S/P LAVH with salpingectomy secondary to menorrhagia, ovaries remain 04/21/12  P:   Pap smear as per guidelines Not done  Mammogram scheduled 12/09/12  Counseled on breast self exam,  adequate intake of calcium and vitamin D, diet and exercise return annually or prn  An After Visit Summary was printed and given to the patient.

## 2012-12-08 NOTE — Patient Instructions (Signed)

## 2012-12-09 ENCOUNTER — Ambulatory Visit
Admission: RE | Admit: 2012-12-09 | Discharge: 2012-12-09 | Disposition: A | Discharge status: Home / Self Care | Payer: BC Managed Care – PPO | Source: Ambulatory Visit

## 2012-12-09 DIAGNOSIS — Z1231 Encounter for screening mammogram for malignant neoplasm of breast: Secondary | ICD-10-CM

## 2012-12-09 NOTE — Progress Notes (Signed)
Encounter reviewed by Dr. Brennan Karam Silva.  

## 2012-12-10 ENCOUNTER — Other Ambulatory Visit (INDEPENDENT_AMBULATORY_CARE_PROVIDER_SITE_OTHER): Payer: BC Managed Care – PPO

## 2012-12-10 DIAGNOSIS — Z0000 Encounter for general adult medical examination without abnormal findings: Secondary | ICD-10-CM

## 2012-12-10 LAB — COMPREHENSIVE METABOLIC PANEL
Albumin: 4.2 g/dL (ref 3.5–5.2)
CO2: 27 mEq/L (ref 19–32)
Glucose, Bld: 86 mg/dL (ref 70–99)
Potassium: 4.3 mEq/L (ref 3.5–5.3)
Sodium: 141 mEq/L (ref 135–145)
Total Protein: 6.6 g/dL (ref 6.0–8.3)

## 2012-12-10 LAB — LIPID PANEL
Cholesterol: 161 mg/dL (ref 0–200)
Triglycerides: 45 mg/dL (ref <150)

## 2012-12-14 ENCOUNTER — Encounter: Payer: Self-pay | Admitting: Nurse Practitioner

## 2012-12-14 ENCOUNTER — Telehealth: Payer: Self-pay | Admitting: *Deleted

## 2012-12-14 NOTE — Telephone Encounter (Signed)
Pt is aware of all normal lab results.  

## 2012-12-14 NOTE — Telephone Encounter (Signed)
Message copied by Osie Bond on Mon Dec 14, 2012  4:50 PM ------      Message from: Ria Comment R      Created: Mon Dec 14, 2012  8:44 AM       Let patient know all results look great. ------

## 2012-12-14 NOTE — Telephone Encounter (Signed)
Message copied by Osie Bond on Mon Dec 14, 2012  9:56 AM ------      Message from: Ria Comment R      Created: Mon Dec 14, 2012  8:44 AM       Let patient know all results look great. ------

## 2012-12-14 NOTE — Telephone Encounter (Signed)
LVM for pt to return my call in regards to lab results.  

## 2013-03-11 ENCOUNTER — Other Ambulatory Visit: Payer: Self-pay

## 2013-05-03 ENCOUNTER — Encounter: Payer: Self-pay | Admitting: Nurse Practitioner

## 2013-12-09 ENCOUNTER — Ambulatory Visit: Payer: BC Managed Care – PPO | Admitting: Nurse Practitioner

## 2014-01-18 ENCOUNTER — Telehealth: Payer: Self-pay | Admitting: Nurse Practitioner

## 2014-01-18 NOTE — Telephone Encounter (Signed)
Pt sent this message via mychart:   Appointment Request From: Debra Caldwell With Provider: Milford Cage, FNP [facility]] Preferred Date Range: Any date 01/25/2014 or later Preferred Times: Tuesday Morning Reason: To address the following health maintenance concerns. Tetanus/Tdap Comments: I have an appointment on 9/22 with [name] at 10:30 am. Can i please have my tetnus shot then? i just had a flu shot at school. Thanks, Debra Caldwell

## 2014-01-18 NOTE — Telephone Encounter (Signed)
Left detailed message at number provided [phone]. Okay per ROI. Advised she will be able to receive tdap at 9/22 appointment as last one on file was in 2003. Advised to remind Milford Cage, FNP and Colletta Maryland at that appointment so we can provide that to her. Advised to call back at [phone] if any further questions.  Routing to provider for final review. Patient agreeable to disposition. Will close encounter

## 2014-01-25 ENCOUNTER — Encounter: Payer: Self-pay | Admitting: Nurse Practitioner

## 2014-01-25 ENCOUNTER — Ambulatory Visit (INDEPENDENT_AMBULATORY_CARE_PROVIDER_SITE_OTHER): Payer: BC Managed Care – PPO | Admitting: Nurse Practitioner

## 2014-01-25 VITALS — BP 102/66 | HR 56 | Ht 62.5 in | Wt 128.0 lb

## 2014-01-25 DIAGNOSIS — Z01419 Encounter for gynecological examination (general) (routine) without abnormal findings: Secondary | ICD-10-CM

## 2014-01-25 DIAGNOSIS — Z23 Encounter for immunization: Secondary | ICD-10-CM | POA: Diagnosis not present

## 2014-01-25 NOTE — Patient Instructions (Addendum)

## 2014-01-25 NOTE — Progress Notes (Addendum)
Patient ID: Debra Caldwell, female   DOB: [birth year], 48 y.o.   MRN: [id number] 48 y.o. [id number] Divorced [place] here for annual exam.  Some vaso symptoms.  Night sweats are worse every other month.  Now hot during the day 5-6 times daily. Having insomnia and sleep issues.  Having brain fog and went off Topamax.  No increase in HA'S.  No vaginal dryness. Not SA since 6 years.  Patient's last menstrual period was 04/05/2012.          Sexually active: no  The current method of family planning is abstinence and status post hysterectomy.  Exercising: yes jogging 2 times per week, walking, hiking, and tennis Smoker: no   Health Maintenance:  Pap: 11/25/11, WNL, neg HR HPV  MMG: 12/09/12, Bi-Rads 1: negative Colonoscopy: 2011, negative repeat in 10 years  BMD: never  TDaP: ? Flu:  01/05/14 Labs:  PCP    reports that she has never smoked. She does not have any smokeless tobacco history on file. She reports that she does not drink alcohol or use illicit drugs.  Past Medical History  Diagnosis Date  . Headache(784.0)     migraines  . Anemia   . Anxiety   . Depression     situational  . Menorrhagia     Past Surgical History  Procedure Laterality Date  . Laparoscopic assisted vaginal hysterectomy  04/21/2012    Procedure: LAPAROSCOPIC ASSISTED VAGINAL HYSTERECTOMY;  Surgeon: Peri Maris, MD;  Location: [facility];  Service: Gynecology;  Laterality: N/A;  . Bilateral salpingectomy  04/21/2012    Procedure: BILATERAL SALPINGECTOMY;  Surgeon: Peri Maris, MD;  Location: [facility];  Service: Gynecology;  Laterality: Bilateral;  . Vaginal hysterectomy  04/21/2012  . Vaginal delivery      x3    Current Outpatient Prescriptions  Medication Sig Dispense Refill  . rizatriptan (MAXALT) 10 MG tablet Take 10 mg by mouth as needed for migraine. May repeat in 2 hours if needed       No current facility-administered medications for this visit.    Family History  Problem  Relation Age of Onset  . Breast cancer Maternal Aunt   . Breast cancer Maternal Grandmother   . Breast cancer Cousin     ROS:  Pertinent items are noted in HPI.  Otherwise, a comprehensive ROS was negative.  Exam:   BP 102/66  Pulse 56  Ht 5' 2.5" (1.588 m)  Wt 128 lb (58.06 kg)  BMI 23.02 kg/m2  LMP 04/05/2012 Height: 5' 2.5" (158.8 cm)  Ht Readings from Last 3 Encounters:  01/25/14 5' 2.5" (1.588 m)  12/08/12 5\' 2"  (1.575 m)  04/03/12 5\' 2"  (1.575 m)    General appearance: alert, cooperative and appears stated age Head: Normocephalic, without obvious abnormality, atraumatic Neck: no adenopathy, supple, symmetrical, trachea midline and thyroid normal to inspection and palpation Lungs: clear to auscultation bilaterally Breasts: normal appearance, no masses or tenderness Heart: regular rate and rhythm Abdomen: soft, non-tender; no masses,  no organomegaly Extremities: extremities normal, atraumatic, no cyanosis or edema Skin: Skin color, texture, turgor normal. No rashes or lesions Lymph nodes: Cervical, supraclavicular, and axillary nodes normal. No abnormal inguinal nodes palpated Neurologic: Grossly normal   Pelvic: External genitalia:  no lesions              Urethra:  normal appearing urethra with no masses, tenderness or lesions  Bartholin's and Skene's: normal                 Vagina: normal appearing vagina with normal color and discharge, no lesions              Cervix: absent              Pap taken: No. Bimanual Exam:  Uterus:  uterus absent              Adnexa: no mass, fullness, tenderness               Rectovaginal: Confirms               Anus:  normal sphincter tone, no lesions  A:  Well Woman with normal exam  S/P LAVH with salpingectomy secondary to menorrhagia, ovaries remain 04/21/12  Vasomotor symptoms  Update immunization   P:   Reviewed health and wellness pertinent to exam  Pap smear not taken today  Mammogram is due now and she  will schedule  TDaP given today  She will try Herbal and OTC Estroven for vaso symptoms.  If not helpful may need ERT  Counseled on breast self exam, adequate intake of calcium and vitamin D, diet and exercise, Kegel's exercises return annually or prn  An After Visit Summary was printed and given to the patient.

## 2014-01-25 NOTE — Progress Notes (Signed)
Encounter reviewed by Dr. Josefa Half.  I recommend yearly mammogram.

## 2014-03-01 ENCOUNTER — Other Ambulatory Visit: Payer: Self-pay

## 2014-03-01 DIAGNOSIS — Z1231 Encounter for screening mammogram for malignant neoplasm of breast: Secondary | ICD-10-CM

## 2014-03-07 ENCOUNTER — Encounter: Payer: Self-pay | Admitting: Nurse Practitioner

## 2014-03-21 ENCOUNTER — Ambulatory Visit
Admission: RE | Admit: 2014-03-21 | Discharge: 2014-03-21 | Disposition: A | Discharge status: Home / Self Care | Payer: BC Managed Care – PPO | Source: Ambulatory Visit

## 2014-03-21 DIAGNOSIS — Z1231 Encounter for screening mammogram for malignant neoplasm of breast: Secondary | ICD-10-CM

## 2014-04-27 ENCOUNTER — Other Ambulatory Visit: Payer: Self-pay | Admitting: Dermatology

## 2015-01-20 ENCOUNTER — Other Ambulatory Visit: Payer: Self-pay

## 2015-01-20 DIAGNOSIS — Z1231 Encounter for screening mammogram for malignant neoplasm of breast: Secondary | ICD-10-CM

## 2015-03-03 ENCOUNTER — Ambulatory Visit (INDEPENDENT_AMBULATORY_CARE_PROVIDER_SITE_OTHER): Payer: BLUE CROSS/BLUE SHIELD | Admitting: Obstetrics & Gynecology

## 2015-03-03 ENCOUNTER — Encounter: Payer: Self-pay | Admitting: Obstetrics & Gynecology

## 2015-03-03 VITALS — BP 106/72 | HR 80 | Resp 16 | Ht 62.5 in | Wt 127.0 lb

## 2015-03-03 DIAGNOSIS — Z0000 Encounter for general adult medical examination without abnormal findings: Secondary | ICD-10-CM | POA: Diagnosis not present

## 2015-03-03 DIAGNOSIS — Z01419 Encounter for gynecological examination (general) (routine) without abnormal findings: Secondary | ICD-10-CM

## 2015-03-03 NOTE — Progress Notes (Signed)
49 y.o. [id number] MarriedCaucasianF here for annual exam.  Doing well.  Very pleased with hysterectomy.  "Best think I've ever done"!!  Denies vaginal bleeding.    PCP:  Dr. Addison Lank  Patient's last menstrual period was 04/05/2012.          Sexually active: No.  The current method of family planning is status post hysterectomy.    Exercising: Yes.    walking/running Smoker:  no  Health Maintenance: Pap:  11/25/11 WNL/negative HR HPV History of abnormal Pap:  no MMG:  03/21/14 3D-BiRads 1-negative Colonoscopy:  2011-repeat 10 years BMD:   none TDaP:  01/25/14  Screening Labs: Plan next year, Urine today: RBC-trace   reports that she has never smoked. She has never used smokeless tobacco. She reports that she drinks about 0.6 - 1.2 oz of alcohol per week. She reports that she does not use illicit drugs.  Past Medical History  Diagnosis Date  . Headache(784.0)     migraines  . Anemia   . Anxiety   . Depression     situational  . Menorrhagia     Past Surgical History  Procedure Laterality Date  . Laparoscopic assisted vaginal hysterectomy  04/21/2012    Procedure: LAPAROSCOPIC ASSISTED VAGINAL HYSTERECTOMY;  Surgeon: Peri Maris, MD;  Location: [facility];  Service: Gynecology;  Laterality: N/A;  . Bilateral salpingectomy  04/21/2012    Procedure: BILATERAL SALPINGECTOMY;  Surgeon: Peri Maris, MD;  Location: [facility];  Service: Gynecology;  Laterality: Bilateral;  . Vaginal hysterectomy  04/21/2012  . Vaginal delivery      x3    Current Outpatient Prescriptions  Medication Sig Dispense Refill  . buPROPion (WELLBUTRIN XL) 150 MG 24 hr tablet   2  . FLUARIX QUADRIVALENT 0.5 ML injection inject 0.5 milliliter intramuscularly  0   No current facility-administered medications for this visit.    Family History  Problem Relation Age of Onset  . Breast cancer Maternal Aunt   . Breast cancer Maternal Grandmother   . Breast cancer Cousin     ROS:  Pertinent items are noted  in HPI.  Otherwise, a comprehensive ROS was negative.  Exam:   BP 106/72 mmHg  Pulse 80  Resp 16  Ht 5' 2.5" (1.588 m)  Wt 127 lb (57.607 kg)  BMI 22.84 kg/m2  LMP 04/05/2012  Weight change: +8#   Height: 5' 2.5" (158.8 cm)  Ht Readings from Last 3 Encounters:  03/03/15 5' 2.5" (1.588 m)  01/25/14 5' 2.5" (1.588 m)  12/08/12 5\' 2"  (1.575 m)    General appearance: alert, cooperative and appears stated age Head: Normocephalic, without obvious abnormality, atraumatic Neck: no adenopathy, supple, symmetrical, trachea midline and thyroid normal to inspection and palpation Lungs: clear to auscultation bilaterally Breasts: normal appearance, no masses or tenderness Heart: regular rate and rhythm Abdomen: soft, non-tender; bowel sounds normal; no masses,  no organomegaly Extremities: extremities normal, atraumatic, no cyanosis or edema Skin: Skin color, texture, turgor normal. No rashes or lesions Lymph nodes: Cervical, supraclavicular, and axillary nodes normal. No abnormal inguinal nodes palpated Neurologic: Grossly normal   Pelvic: External genitalia:  no lesions              Urethra:  normal appearing urethra with no masses, tenderness or lesions              Bartholins and Skenes: normal                 Vagina: normal appearing vagina  with normal color and discharge, no lesions              Cervix: absent              Pap taken: No. Bimanual Exam:  Uterus:  uterus absent              Adnexa: normal adnexa               Rectovaginal: Confirms               Anus:  normal sphincter tone, no lesions  Chaperone was present for exam.  A:  Well Woman with normal exam s/p LAVH with salpingectomy secondary to menorrhagia, ovaries remain 04/21/12 Vasomotor symptoms  P: Mammogram yearly Pap smear not indicated due to hysterectomy and negative pathology Plan fasting labs next year Return annually or prn

## 2015-03-23 ENCOUNTER — Ambulatory Visit
Admission: RE | Admit: 2015-03-23 | Discharge: 2015-03-23 | Disposition: A | Discharge status: Home / Self Care | Payer: BLUE CROSS/BLUE SHIELD | Source: Ambulatory Visit

## 2015-03-23 DIAGNOSIS — Z1231 Encounter for screening mammogram for malignant neoplasm of breast: Secondary | ICD-10-CM

## 2015-07-06 ENCOUNTER — Other Ambulatory Visit: Payer: Self-pay | Admitting: Ophthalmology

## 2016-03-05 ENCOUNTER — Other Ambulatory Visit: Payer: Self-pay | Admitting: Obstetrics & Gynecology

## 2016-03-05 DIAGNOSIS — Z1231 Encounter for screening mammogram for malignant neoplasm of breast: Secondary | ICD-10-CM

## 2016-03-25 ENCOUNTER — Ambulatory Visit
Admission: RE | Admit: 2016-03-25 | Discharge: 2016-03-25 | Disposition: A | Discharge status: Home / Self Care | Payer: 59 | Source: Ambulatory Visit | Attending: Obstetrics & Gynecology | Admitting: Obstetrics & Gynecology

## 2016-03-25 DIAGNOSIS — Z1231 Encounter for screening mammogram for malignant neoplasm of breast: Secondary | ICD-10-CM

## 2016-05-27 NOTE — Progress Notes (Signed)
51 y.o. [id number] MarriedCaucasianF here for annual exam.  Dating someone this year.  Relationship is really good.    No vaginal bleeding.    Patient's last menstrual period was 04/05/2012.          Sexually active: Yes.    The current method of family planning is status post hysterectomy.    Exercising: Yes.    elliptical, running, weights Smoker:  no  Health Maintenance: Pap:  11/25/11 negative  History of abnormal Pap:  no MMG:  03/27/16 BIRADS 1 negative  Colonoscopy:  2011- repeat 10 years  BMD:   never TDaP:  01/25/14 Pneumonia vaccine(s):  never Zostavax:   never Hep C testing: not indicated  Screening Labs: drawn today, Hb today: same, Urine today: normal    reports that she has never smoked. She has never used smokeless tobacco. She reports that she drinks about 0.6 - 1.2 oz of alcohol per week . She reports that she does not use drugs.  Past Medical History:  Diagnosis Date  . Anemia   . Anxiety   . Depression    situational  . Headache(784.0)    migraines  . Menorrhagia     Past Surgical History:  Procedure Laterality Date  . BILATERAL SALPINGECTOMY  04/21/2012   Procedure: BILATERAL SALPINGECTOMY;  Surgeon: Peri Maris, MD;  Location: [facility];  Service: Gynecology;  Laterality: Bilateral;  . LAPAROSCOPIC ASSISTED VAGINAL HYSTERECTOMY  04/21/2012   Procedure: LAPAROSCOPIC ASSISTED VAGINAL HYSTERECTOMY;  Surgeon: Peri Maris, MD;  Location: [facility];  Service: Gynecology;  Laterality: N/A;  . VAGINAL DELIVERY     x3  . VAGINAL HYSTERECTOMY  04/21/2012    Current Outpatient Prescriptions  Medication Sig Dispense Refill  . ampicillin (PRINCIPEN) 500 MG capsule as needed.     . tretinoin (RETIN-A) 0.05 % cream as needed.      No current facility-administered medications for this visit.     Family History  Problem Relation Age of Onset  . Breast cancer Maternal Aunt   . Breast cancer Maternal Grandmother   . Breast cancer Cousin     ROS:  Pertinent  items are noted in HPI.  Otherwise, a comprehensive ROS was negative.  Exam:   BP 98/62 (BP Location: Right Arm, Patient Position: Sitting, Cuff Size: Normal)   Pulse 68   Resp 12   Ht 5' 2.5" (1.588 m)   Wt 130 lb (59 kg)   LMP 04/05/2012   BMI 23.40 kg/m   Weight change: +3#  Height: 5' 2.5" (158.8 cm)  Ht Readings from Last 3 Encounters:  05/28/16 5' 2.5" (1.588 m)  03/03/15 5' 2.5" (1.588 m)  01/25/14 5' 2.5" (1.588 m)    General appearance: alert, cooperative and appears stated age Head: Normocephalic, without obvious abnormality, atraumatic Neck: no adenopathy, supple, symmetrical, trachea midline and thyroid normal to inspection and palpation Lungs: clear to auscultation bilaterally Breasts: normal appearance, no masses or tenderness Heart: regular rate and rhythm Abdomen: soft, non-tender; bowel sounds normal; no masses,  no organomegaly Extremities: extremities normal, atraumatic, no cyanosis or edema Skin: Skin color, texture, turgor normal. No rashes or lesions Lymph nodes: Cervical, supraclavicular, and axillary nodes normal. No abnormal inguinal nodes palpated Neurologic: Grossly normal   Pelvic: External genitalia:  no lesions              Urethra:  normal appearing urethra with no masses, tenderness or lesions  Bartholins and Skenes: normal                 Vagina: normal appearing vagina with normal color and discharge, no lesions              Cervix: absent              Pap taken: No. Bimanual Exam:  Uterus:  uterus absent              Adnexa: no mass, fullness, tenderness               Rectovaginal: Confirms               Anus:  normal sphincter tone, no lesions  Chaperone was present for exam.  A:    Well Woman with normal exam S/p LAVH with salpingectomy secondary to menorrhagia (no BSO) Newly dating  P: Mammogram yearly Pap smear not indicated HIV, RPR, Hep B antibody obtained.  GC/Chl pending CMP, CBC, TSH, Vit D, Lipid  panel today Return annually or prn

## 2016-05-28 ENCOUNTER — Ambulatory Visit (INDEPENDENT_AMBULATORY_CARE_PROVIDER_SITE_OTHER): Payer: 59 | Admitting: Obstetrics & Gynecology

## 2016-05-28 ENCOUNTER — Encounter: Payer: Self-pay | Admitting: Obstetrics & Gynecology

## 2016-05-28 VITALS — BP 98/62 | HR 68 | Resp 12 | Ht 62.5 in | Wt 130.0 lb

## 2016-05-28 DIAGNOSIS — E2839 Other primary ovarian failure: Secondary | ICD-10-CM | POA: Diagnosis not present

## 2016-05-28 DIAGNOSIS — Z202 Contact with and (suspected) exposure to infections with a predominantly sexual mode of transmission: Secondary | ICD-10-CM

## 2016-05-28 DIAGNOSIS — Z01419 Encounter for gynecological examination (general) (routine) without abnormal findings: Secondary | ICD-10-CM | POA: Diagnosis not present

## 2016-05-28 DIAGNOSIS — Z0000 Encounter for general adult medical examination without abnormal findings: Secondary | ICD-10-CM | POA: Diagnosis not present

## 2016-05-28 LAB — POCT URINALYSIS DIPSTICK
BILIRUBIN UA: NEGATIVE
Blood, UA: NEGATIVE
GLUCOSE UA: NEGATIVE
KETONES UA: NEGATIVE
Leukocytes, UA: NEGATIVE
NITRITE UA: NEGATIVE
Protein, UA: NEGATIVE
Urobilinogen, UA: NEGATIVE
pH, UA: 7

## 2016-05-28 LAB — COMPREHENSIVE METABOLIC PANEL
ALK PHOS: 76 U/L (ref 33–130)
ALT: 12 U/L (ref 6–29)
AST: 18 U/L (ref 10–35)
Albumin: 4.1 g/dL (ref 3.6–5.1)
BILIRUBIN TOTAL: 0.3 mg/dL (ref 0.2–1.2)
BUN: 17 mg/dL (ref 7–25)
CO2: 29 mmol/L (ref 20–31)
CREATININE: 0.71 mg/dL (ref 0.50–1.05)
Calcium: 9.4 mg/dL (ref 8.6–10.4)
Chloride: 104 mmol/L (ref 98–110)
GLUCOSE: 92 mg/dL (ref 65–99)
Potassium: 4.1 mmol/L (ref 3.5–5.3)
Sodium: 139 mmol/L (ref 135–146)
TOTAL PROTEIN: 7 g/dL (ref 6.1–8.1)

## 2016-05-28 LAB — CBC
HCT: 38 % (ref 35.0–45.0)
Hemoglobin: 12.4 g/dL (ref 11.7–15.5)
MCH: 31.3 pg (ref 27.0–33.0)
MCHC: 32.6 g/dL (ref 32.0–36.0)
MCV: 96 fL (ref 80.0–100.0)
MPV: 10.3 fL (ref 7.5–12.5)
PLATELETS: 314 10*3/uL (ref 140–400)
RBC: 3.96 MIL/uL (ref 3.80–5.10)
RDW: 14 % (ref 11.0–15.0)
WBC: 6.9 10*3/uL (ref 3.8–10.8)

## 2016-05-28 LAB — LIPID PANEL
Cholesterol: 202 mg/dL — ABNORMAL HIGH (ref <200)
HDL: 81 mg/dL (ref >50)
LDL Cholesterol: 94 mg/dL (ref <100)
Total CHOL/HDL Ratio: 2.5 Ratio (ref <5.0)
Triglycerides: 137 mg/dL (ref <150)
VLDL: 27 mg/dL (ref <30)

## 2016-05-28 LAB — TSH: TSH: 1.39 m[IU]/L

## 2016-05-28 NOTE — Addendum Note (Signed)
Addended by: Megan Salon on: 05/28/2016 04:55 PM   Modules accepted: Orders

## 2016-05-29 LAB — STD PANEL
HIV: NONREACTIVE
Hepatitis B Surface Ag: NEGATIVE

## 2016-05-29 LAB — GC/CHLAMYDIA PROBE AMP
CT PROBE, AMP APTIMA: NOT DETECTED
GC PROBE AMP APTIMA: NOT DETECTED

## 2016-05-29 LAB — VITAMIN D 25 HYDROXY (VIT D DEFICIENCY, FRACTURES): VIT D 25 HYDROXY: 26 ng/mL — AB (ref 30–100)

## 2016-06-28 ENCOUNTER — Ambulatory Visit: Payer: BLUE CROSS/BLUE SHIELD | Admitting: Obstetrics & Gynecology

## 2017-02-21 ENCOUNTER — Other Ambulatory Visit: Payer: Self-pay | Admitting: Obstetrics & Gynecology

## 2017-02-21 DIAGNOSIS — Z1231 Encounter for screening mammogram for malignant neoplasm of breast: Secondary | ICD-10-CM

## 2017-03-31 ENCOUNTER — Ambulatory Visit
Admission: RE | Admit: 2017-03-31 | Discharge: 2017-03-31 | Disposition: A | Discharge status: Home / Self Care | Payer: 59 | Source: Ambulatory Visit | Attending: Obstetrics & Gynecology | Admitting: Obstetrics & Gynecology

## 2017-03-31 DIAGNOSIS — Z1231 Encounter for screening mammogram for malignant neoplasm of breast: Secondary | ICD-10-CM

## 2017-03-31 DIAGNOSIS — E2839 Other primary ovarian failure: Secondary | ICD-10-CM

## 2017-07-07 ENCOUNTER — Telehealth: Payer: Self-pay | Admitting: Obstetrics & Gynecology

## 2017-07-07 NOTE — Telephone Encounter (Signed)
Spoke with patient. Reports taking a running class last fall, feels she pulled a muscle in left buttocks. Tried yoga, made the pain worse. Has since been experiencing left lower back pain, has developed "shooting pain" into left knee cap. Has been applying ice with little relief.   Denies any GYN complainants.   Patient requesting referral for [facility]. Recommended patient f/u with PCP for further evaluation and referral. Patient states she has not seen Dr Leonides Schanz in 51yrs, "sees Dr. Sabra Heck for everything". Recommended patient seek care at local ER/Urgent care for evaluation. Patient states she will wait to see Dr. Sabra Heck on 07/16/17 for AEX "if she has to". Advised patient Dr. Sabra Heck will review, I will return call with any additional recommendations, patient is agreeable.   Routing to provider for final review. Patient is agreeable to disposition. Will close encounter.

## 2017-07-07 NOTE — Telephone Encounter (Signed)
Patient having lower back pain since November and needs referral to be seen at [facility] outpatient facility.

## 2017-07-08 NOTE — Telephone Encounter (Signed)
Spoke with patient, advised as seen below per Dr. Sabra Heck. Patient provided with contact information for Dr. Lynann Bologna at [place], [phone].   Routing to provider for final review. Patient is agreeable to disposition. Will close encounter.

## 2017-07-08 NOTE — Telephone Encounter (Signed)
Honestly, I think she needs to start with ortho as there may be some imaging that should be done before starting PT.  She can get in with Dr. Lynann Bologna quickly.  Please provide information to her about this.  She doesn't need to wait to see me for this appt.  Thanks.

## 2017-07-16 ENCOUNTER — Ambulatory Visit: Payer: BLUE CROSS/BLUE SHIELD | Admitting: Obstetrics & Gynecology

## 2017-07-16 ENCOUNTER — Encounter: Payer: Self-pay | Admitting: Obstetrics & Gynecology

## 2017-07-16 ENCOUNTER — Other Ambulatory Visit: Payer: Self-pay

## 2017-07-16 VITALS — BP 100/60 | HR 72 | Resp 14 | Ht 62.5 in | Wt 129.0 lb

## 2017-07-16 DIAGNOSIS — Z01419 Encounter for gynecological examination (general) (routine) without abnormal findings: Secondary | ICD-10-CM | POA: Diagnosis not present

## 2017-07-16 NOTE — Progress Notes (Addendum)
52 y.o. [id number] Divorced CaucasianF here for annual exam.  Dating significant other for 18 months.  They are thinking about "taking the next step".  Debra Caldwell, youngest, got into [place].  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    Having some stress with thinking about being an empty nester, possibly moving.   Denies vaginal bleeding.    Patient's last menstrual period was 04/05/2012.          Sexually active: Yes.    The current method of family planning is status post hysterectomy.    Exercising: No.   Smoker:  no  Health Maintenance:  Pap:  11/25/11 Neg  History of abnormal Pap:  no MMG:  03/31/17 BIRADS1:Neg  Colonoscopy:  2011 f/u 10 years  BMD:   03/31/17 Osteopenia  TDaP:  01/2014 Pneumonia vaccine(s):  n/a Shingrix:  Unsure Hep C testing: n/a Screening Labs: fasting labs today     reports that  has never smoked. she has never used smokeless tobacco. She reports that she drinks about 0.6 - 1.2 oz of alcohol per week. She reports that she does not use drugs.  Past Medical History:  Diagnosis Date  . Anemia   . Anxiety   . Depression    situational  . Headache(784.0)    migraines  . Menorrhagia     Past Surgical History:  Procedure Laterality Date  . BILATERAL SALPINGECTOMY  04/21/2012   Procedure: BILATERAL SALPINGECTOMY;  Surgeon: Peri Maris, MD;  Location: [facility];  Service: Gynecology;  Laterality: Bilateral;  . LAPAROSCOPIC ASSISTED VAGINAL HYSTERECTOMY  04/21/2012   Procedure: LAPAROSCOPIC ASSISTED VAGINAL HYSTERECTOMY;  Surgeon: Peri Maris, MD;  Location: [facility];  Service: Gynecology;  Laterality: N/A;  . VAGINAL DELIVERY     x3  . VAGINAL HYSTERECTOMY  04/21/2012    Current Outpatient Medications  Medication Sig Dispense Refill  . b complex vitamins capsule Take 1 capsule by mouth daily.    . Cholecalciferol (VITAMIN D-3 PO) Take 3,000 Units by mouth daily.    Marland Kitchen tretinoin (RETIN-A) 0.05 % cream as needed.      No current facility-administered  medications for this visit.     Family History  Problem Relation Age of Onset  . Breast cancer Maternal Aunt   . Breast cancer Maternal Grandmother   . Breast cancer Cousin     ROS:  Pertinent items are noted in HPI.  Otherwise, a comprehensive ROS was negative.  Exam:   BP 100/60 (BP Location: Right Arm, Patient Position: Sitting, Cuff Size: Normal)   Pulse 72   Resp 14   Ht 5' 2.5" (1.588 m)   Wt 129 lb (58.5 kg)   LMP 04/05/2012   BMI 23.22 kg/m     Height: 5' 2.5" (158.8 cm)  Ht Readings from Last 3 Encounters:  07/16/17 5' 2.5" (1.588 m)  05/28/16 5' 2.5" (1.588 m)  03/03/15 5' 2.5" (1.588 m)    General appearance: alert, cooperative and appears stated age Head: Normocephalic, without obvious abnormality, atraumatic Neck: no adenopathy, supple, symmetrical, trachea midline and thyroid normal to inspection and palpation Lungs: clear to auscultation bilaterally Breasts: normal appearance, no masses or tenderness Heart: regular rate and rhythm Abdomen: soft, non-tender; bowel sounds normal; no masses,  no organomegaly Extremities: extremities normal, atraumatic, no cyanosis or edema Skin: Skin color, texture, turgor normal. No rashes or lesions Lymph nodes: Cervical, supraclavicular, and axillary nodes normal. No abnormal inguinal nodes palpated Neurologic: Grossly normal   Pelvic: External genitalia:  no lesions              Urethra:  normal appearing urethra with no masses, tenderness or lesions              Bartholins and Skenes: normal                 Vagina: normal appearing vagina with normal color and discharge, no lesions              Cervix: absent              Pap taken: No. Bimanual Exam:  Uterus:  uterus absent              Adnexa: no mass, fullness, tenderness               Rectovaginal: Declined rectal exam  Chaperone was present for exam.  A:  Well Woman with normal exam H/O LAVH with salpingectomy 12/13 Osteopenia  P:   Mammogram guidelines  reviewed Pap not indicated Plan BMD in 2-3 years Shingrix vaccine discussed No blood work obtained Return annually or prn

## 2017-08-29 ENCOUNTER — Ambulatory Visit: Payer: 59 | Admitting: Obstetrics & Gynecology

## 2017-12-31 ENCOUNTER — Telehealth: Payer: Self-pay | Admitting: Obstetrics & Gynecology

## 2017-12-31 NOTE — Telephone Encounter (Signed)
Patient left voicemail over lunch. Patient has decided to move forward with hormone replacement therapy and is wanting to know her next steps to starting that process.

## 2017-12-31 NOTE — Telephone Encounter (Signed)
Left message to call Lyn Joens at [phone].  

## 2018-01-07 NOTE — Telephone Encounter (Signed)
Left message to call Priscila Bean at [phone].  

## 2018-01-07 NOTE — Telephone Encounter (Signed)
Spoke with patient. Requesting to start HRT. Reports increased hot flashes and mood changes. Hx of hysterectomy.   Recommended OV for further discussion with Dr. Sabra Heck. OV scheduled for 9/6 at 3pm.   Routing to provider for final review. Patient is agreeable to disposition. Will close encounter.

## 2018-01-09 ENCOUNTER — Ambulatory Visit (INDEPENDENT_AMBULATORY_CARE_PROVIDER_SITE_OTHER): Payer: BLUE CROSS/BLUE SHIELD | Admitting: Obstetrics & Gynecology

## 2018-01-09 ENCOUNTER — Encounter: Payer: Self-pay | Admitting: Obstetrics & Gynecology

## 2018-01-09 VITALS — BP 98/60 | HR 76 | Resp 16 | Ht 62.5 in | Wt 128.8 lb

## 2018-01-09 DIAGNOSIS — N951 Menopausal and female climacteric states: Secondary | ICD-10-CM | POA: Diagnosis not present

## 2018-01-09 DIAGNOSIS — Z79890 Hormone replacement therapy (postmenopausal): Secondary | ICD-10-CM

## 2018-01-09 MED ORDER — ESTRADIOL 0.05 MG/24HR TD PTTW: 1.0000 | MEDICATED_PATCH | TRANSDERMAL | 2 refills | Status: DC

## 2018-01-09 NOTE — Progress Notes (Signed)
GYNECOLOGY  VISIT  CC:   Discuss HRT  HPI: 52 y.o. [id number] Divorced Caucasian female here for menopause symptoms.  Having a lot of issues with hot flashes that worsened over the summer.  Has sweating with the hot flashes.  Hot flashes last about 15 minutes and seem to start in her core and work outwardly.  They are mostly in the day.  Not having a lot of night sweats.  Feels like her emotions are "out of control" as well.  Lorre Nick, her youngest, just started at [employer].  She now has an empty house.  Did stay with significant other in [place] this summer.  Feels he has an unusual and unhealthy relationship with his ex-wife.  Working through how she feels about this.  Has discussed counseling with him.  Not having any issues with vaginal dryness.  Does not feel as lubricated as in the past but not having painful intercourse.   GYNECOLOGIC HISTORY: Patient's last menstrual period was 04/05/2012. Contraception: hysterectomy  Menopausal hormone therapy: none  Patient Active Problem List   Diagnosis Date Noted  . Postmenopausal HRT (hormone replacement therapy) 01/12/2018    Past Medical History:  Diagnosis Date  . Anemia   . Anxiety   . Depression    situational  . Headache(784.0)    migraines  . Menorrhagia     Past Surgical History:  Procedure Laterality Date  . BILATERAL SALPINGECTOMY  04/21/2012   Procedure: BILATERAL SALPINGECTOMY;  Surgeon: Peri Maris, MD;  Location: [facility];  Service: Gynecology;  Laterality: Bilateral;  . LAPAROSCOPIC ASSISTED VAGINAL HYSTERECTOMY  04/21/2012   Procedure: LAPAROSCOPIC ASSISTED VAGINAL HYSTERECTOMY;  Surgeon: Peri Maris, MD;  Location: [facility];  Service: Gynecology;  Laterality: N/A;    MEDS:   Current Outpatient Medications on File Prior to Visit  Medication Sig Dispense Refill  . b complex vitamins capsule Take 1 capsule by mouth daily.    . Cholecalciferol (VITAMIN D-3 PO) Take 3,000 Units by mouth daily.    Marland Kitchen tretinoin (RETIN-A)  0.05 % cream as needed.      No current facility-administered medications on file prior to visit.     ALLERGIES: Patient has no known allergies.  Family History  Problem Relation Age of Onset  . Breast cancer Maternal Aunt   . Breast cancer Maternal Grandmother   . Breast cancer Cousin     SH:  Divorced, non smoker  Review of Systems  Constitutional:       Weight gain   Musculoskeletal: Positive for myalgias.  Skin:       Excess hair growth   Endo/Heme/Allergies:       Heat intolerance   All other systems reviewed and are negative.   PHYSICAL EXAMINATION:    BP 98/60 (BP Location: Right Arm, Patient Position: Sitting, Cuff Size: Normal)   Pulse 76   Resp 16   Ht 5' 2.5" (1.588 m)   Wt 128 lb 12.8 oz (58.4 kg)   LMP 04/05/2012   BMI 23.18 kg/m     General appearance: alert, cooperative and appears stated age  Discussed with patient risks and benefits and specifically the WHI study including but not limited to risks of increased risks of heart disease, MI, stroke, DVT, and breast cancer.  Increased risks of gall bladder disease and change in cholesterol panels also discussed.  Possibility of bleeding was not discussed as patient does not have a uterus.  Benefits of improved quality of life, improved bone density and decreased  risks of colon cancer also discussed.   Assessment: Vasomotor symptoms  Plan: Vivelle dot 0.05mg  patches twice weekly to skin.  Rx to pharmacy.  Aware this is a dosage that can be increased.  Advised to call and give update in 4-6 weeks, or sooner if needed.   ~25 minutes spent with patient >50% of time was in face to face discussion of above.

## 2018-01-12 ENCOUNTER — Encounter: Payer: Self-pay | Admitting: Obstetrics & Gynecology

## 2018-01-12 DIAGNOSIS — Z79890 Hormone replacement therapy (postmenopausal): Secondary | ICD-10-CM | POA: Insufficient documentation

## 2018-02-10 ENCOUNTER — Encounter: Payer: Self-pay | Admitting: Obstetrics & Gynecology

## 2018-02-11 ENCOUNTER — Other Ambulatory Visit: Payer: Self-pay | Admitting: Obstetrics & Gynecology

## 2018-02-11 ENCOUNTER — Telehealth: Payer: Self-pay | Admitting: Obstetrics & Gynecology

## 2018-02-11 MED ORDER — ESTRADIOL 0.05 MG/24HR TD PTTW: 1.0000 | MEDICATED_PATCH | TRANSDERMAL | 3 refills | Status: DC

## 2018-02-11 NOTE — Telephone Encounter (Signed)
RF for 90 day supply to [place] has been done.  Ok to let pt know.  If she desires monthly, she can just ask pharmacy to dispense as monthly.  Thanks.

## 2018-02-11 NOTE — Telephone Encounter (Signed)
Spoke with patient, advised as seen below per Dr. Miller. Patient verbalizes understanding and is agreeable.   Encounter closed.  

## 2018-02-11 NOTE — Telephone Encounter (Signed)
Patient sent the following correspondence through [facility]. Routing to triage to assist patient with request.  Hi Dr. Sabra Heck,    I am really liking my new Hormones! Do I need to call in when my prescription runs out? I believe you gave me a 3 month refill and I am on my 2nd month.... Just let me know what I need to do next.     Thanks so much!!    Risa Grill

## 2018-02-11 NOTE — Telephone Encounter (Signed)
Dr. Sabra Heck -ok to send refill of Vivelle Dot 0.05mg  patch until next AEX 09/24/18?

## 2018-04-04 ENCOUNTER — Other Ambulatory Visit: Payer: Self-pay | Admitting: Obstetrics & Gynecology

## 2018-05-04 ENCOUNTER — Other Ambulatory Visit: Payer: Self-pay | Admitting: Obstetrics & Gynecology

## 2018-05-04 DIAGNOSIS — Z1231 Encounter for screening mammogram for malignant neoplasm of breast: Secondary | ICD-10-CM

## 2018-06-04 ENCOUNTER — Ambulatory Visit
Admission: RE | Admit: 2018-06-04 | Discharge: 2018-06-04 | Disposition: A | Discharge status: Home / Self Care | Payer: 59 | Source: Ambulatory Visit | Attending: Obstetrics & Gynecology | Admitting: Obstetrics & Gynecology

## 2018-06-04 DIAGNOSIS — Z1231 Encounter for screening mammogram for malignant neoplasm of breast: Secondary | ICD-10-CM

## 2018-09-22 ENCOUNTER — Other Ambulatory Visit: Payer: Self-pay

## 2018-09-24 ENCOUNTER — Encounter: Payer: Self-pay | Admitting: Obstetrics & Gynecology

## 2018-09-24 ENCOUNTER — Ambulatory Visit: Payer: BLUE CROSS/BLUE SHIELD | Admitting: Obstetrics & Gynecology

## 2018-09-24 ENCOUNTER — Encounter

## 2018-09-24 ENCOUNTER — Other Ambulatory Visit: Payer: Self-pay

## 2018-09-24 VITALS — BP 120/70 | HR 72 | Temp 97.9°F | Ht 62.5 in | Wt 132.0 lb

## 2018-09-24 DIAGNOSIS — Z01419 Encounter for gynecological examination (general) (routine) without abnormal findings: Secondary | ICD-10-CM

## 2018-09-24 DIAGNOSIS — Z0000 Encounter for general adult medical examination without abnormal findings: Secondary | ICD-10-CM

## 2018-09-24 MED ORDER — ESTRADIOL 0.05 MG/24HR TD PTTW: 1.0000 | MEDICATED_PATCH | TRANSDERMAL | 4 refills | Status: DC

## 2018-09-24 NOTE — Progress Notes (Signed)
53 y.o. [id number] Divorced White or Caucasian female here for annual exam.  Reports she's having some issues with depression due to her extroversion.  Work is stressful with remote learning.  She did get engaged in January.  He wants her to move into his home.    Denies vaginal bleeding.    Patient's last menstrual period was 04/05/2012.          Sexually active: Yes.    The current method of family planning is status post hysterectomy.    Exercising: Yes.    tennis, walking, run Smoker:  no   Health Maintenance: Pap: 11-25-11 neg History of abnormal Pap:  no MMG:  06-04-2018 category b density birads 1:neg Colonoscopy:  2011 follow up 10 years BMD:   2018 osteopenia TDaP:  2015 Pneumonia vaccine(s):  n/a Shingrix:   No.  Discussed vaccination with pt Hep C testing: n/a Screening Labs: here today    reports that she has never smoked. She has never used smokeless tobacco. She reports current alcohol use of about 7.0 standard drinks of alcohol per week. She reports that she does not use drugs.  Past Medical History:  Diagnosis Date  . Anemia   . Anxiety   . Depression    situational  . Headache(784.0)    migraines  . Menorrhagia     Past Surgical History:  Procedure Laterality Date  . BILATERAL SALPINGECTOMY  04/21/2012   Procedure: BILATERAL SALPINGECTOMY;  Surgeon: Peri Maris, MD;  Location: [facility];  Service: Gynecology;  Laterality: Bilateral;  . LAPAROSCOPIC ASSISTED VAGINAL HYSTERECTOMY  04/21/2012   Procedure: LAPAROSCOPIC ASSISTED VAGINAL HYSTERECTOMY;  Surgeon: Peri Maris, MD;  Location: [facility];  Service: Gynecology;  Laterality: N/A;    Current Outpatient Medications  Medication Sig Dispense Refill  . Cholecalciferol (VITAMIN D-3 PO) Take 3,000 Units by mouth daily.    Marland Kitchen estradiol (VIVELLE-DOT) 0.05 MG/24HR patch Place 1 patch (0.05 mg total) onto the skin 2 (two) times a week. 24 patch 3  . tretinoin (RETIN-A) 0.05 % cream as needed.      No current  facility-administered medications for this visit.     Family History  Problem Relation Age of Onset  . Breast cancer Maternal Aunt   . Breast cancer Maternal Grandmother   . Breast cancer Cousin     Review of Systems  All other systems reviewed and are negative.   Exam:   BP 120/70   Pulse 72   Temp 97.9 F (36.6 C) (Temporal)   Ht 5' 2.5" (1.588 m)   Wt 132 lb (59.9 kg)   LMP 04/05/2012   BMI 23.76 kg/m     Height: 5' 2.5" (158.8 cm)  Ht Readings from Last 3 Encounters:  09/24/18 5' 2.5" (1.588 m)  01/09/18 5' 2.5" (1.588 m)  07/16/17 5' 2.5" (1.588 m)    General appearance: alert, cooperative and appears stated age Head: Normocephalic, without obvious abnormality, atraumatic Neck: no adenopathy, supple, symmetrical, trachea midline and thyroid normal to inspection and palpation Lungs: clear to auscultation bilaterally Breasts: normal appearance, no masses or tenderness Heart: regular rate and rhythm Abdomen: soft, non-tender; bowel sounds normal; no masses,  no organomegaly Extremities: extremities normal, atraumatic, no cyanosis or edema Skin: Skin color, texture, turgor normal. No rashes or lesions Lymph nodes: Cervical, supraclavicular, and axillary nodes normal. No abnormal inguinal nodes palpated Neurologic: Grossly normal  Pelvic: External genitalia:  no lesions  Urethra:  normal appearing urethra with no masses, tenderness or lesions              Bartholins and Skenes: normal                 Vagina: normal appearing vagina with normal color and discharge, no lesions              Cervix: absent              Pap taken: No. Bimanual Exam:  Uterus:  uterus absent              Adnexa: normal adnexa and no mass, fullness, tenderness               Rectovaginal: Confirms               Anus:  normal sphincter tone, no lesions  Chaperone was present for exam.  A:  Well Woman with normal exam H/O LAVH with salpingectomy 12/13 Osteopenia On  HRT  P:   Mammogram guidelines reviewed pap smear not indicated RF for vivelle dot 0.05mg  patches twice weekly.  #24/4RF. Plan colon cancer screening next year.  She is interested in cologuard next year. CMP, Lipids, TSH, Vit D, CBC Shingrix vaccination discussed.  Pt is not going to get this right now. Return annually or prn

## 2018-09-25 LAB — COMPREHENSIVE METABOLIC PANEL
ALT: 13 IU/L (ref 0–32)
AST: 16 IU/L (ref 0–40)
Albumin/Globulin Ratio: 2 (ref 1.2–2.2)
Albumin: 4.9 g/dL (ref 3.8–4.9)
Alkaline Phosphatase: 94 IU/L (ref 39–117)
BUN/Creatinine Ratio: 18 (ref 9–23)
BUN: 13 mg/dL (ref 6–24)
Bilirubin Total: 0.4 mg/dL (ref 0.0–1.2)
CO2: 25 mmol/L (ref 20–29)
Calcium: 9.9 mg/dL (ref 8.7–10.2)
Chloride: 102 mmol/L (ref 96–106)
Creatinine, Ser: 0.71 mg/dL (ref 0.57–1.00)
GFR calc Af Amer: 113 mL/min/{1.73_m2} (ref >59)
GFR calc non Af Amer: 98 mL/min/{1.73_m2} (ref >59)
Globulin, Total: 2.5 g/dL (ref 1.5–4.5)
Glucose: 83 mg/dL (ref 65–99)
Potassium: 4.3 mmol/L (ref 3.5–5.2)
Sodium: 141 mmol/L (ref 134–144)
Total Protein: 7.4 g/dL (ref 6.0–8.5)

## 2018-09-25 LAB — CBC
Hematocrit: 39.6 % (ref 34.0–46.6)
Hemoglobin: 13.1 g/dL (ref 11.1–15.9)
MCH: 31.9 pg (ref 26.6–33.0)
MCHC: 33.1 g/dL (ref 31.5–35.7)
MCV: 96 fL (ref 79–97)
Platelets: 337 10*3/uL (ref 150–450)
RBC: 4.11 x10E6/uL (ref 3.77–5.28)
RDW: 12.9 % (ref 11.7–15.4)
WBC: 6.5 10*3/uL (ref 3.4–10.8)

## 2018-09-25 LAB — VITAMIN D 25 HYDROXY (VIT D DEFICIENCY, FRACTURES): Vit D, 25-Hydroxy: 38.5 ng/mL (ref 30.0–100.0)

## 2018-09-25 LAB — TSH: TSH: 1.61 u[IU]/mL (ref 0.450–4.500)

## 2018-09-25 LAB — LIPID PANEL
Chol/HDL Ratio: 3 ratio (ref 0.0–4.4)
Cholesterol, Total: 210 mg/dL — ABNORMAL HIGH (ref 100–199)
HDL: 71 mg/dL (ref >39)
LDL Calculated: 117 mg/dL — ABNORMAL HIGH (ref 0–99)
Triglycerides: 110 mg/dL (ref 0–149)
VLDL Cholesterol Cal: 22 mg/dL (ref 5–40)

## 2018-10-13 ENCOUNTER — Encounter: Payer: Self-pay | Admitting: Obstetrics & Gynecology

## 2018-10-13 ENCOUNTER — Telehealth: Payer: Self-pay | Admitting: *Deleted

## 2018-10-13 NOTE — Telephone Encounter (Signed)
MyChart message reviewed with nursing supervisor.   Call placed to [facility] to review criteria for [facility] Covid 19 testing.   Call to patient in f/u to 10/13/18 MyChart message per Dr. Ammie Ferrier request.   Spoke with patient. Covid-19 prescreening negative. Advised patient she does not meet criteria for [facility] Covid19 testing. Advised patient to contact her PCP or [facility] at [phone] for additional testing options. Patient verbalizes understanding and is agreeable.    Routing to provider for final review. Patient is agreeable to disposition. Will close encounter.

## 2019-04-13 ENCOUNTER — Other Ambulatory Visit: Payer: Self-pay | Admitting: Family Medicine

## 2019-04-13 DIAGNOSIS — Z1231 Encounter for screening mammogram for malignant neoplasm of breast: Secondary | ICD-10-CM

## 2019-05-03 ENCOUNTER — Ambulatory Visit: Payer: BC Managed Care – PPO | Attending: Internal Medicine

## 2019-06-07 ENCOUNTER — Ambulatory Visit: Payer: Self-pay

## 2019-06-30 ENCOUNTER — Other Ambulatory Visit: Payer: Self-pay

## 2019-06-30 ENCOUNTER — Ambulatory Visit
Admission: RE | Admit: 2019-06-30 | Discharge: 2019-06-30 | Disposition: A | Discharge status: Home / Self Care | Payer: BC Managed Care – PPO | Source: Ambulatory Visit | Attending: Family Medicine | Admitting: Family Medicine

## 2019-06-30 DIAGNOSIS — Z1231 Encounter for screening mammogram for malignant neoplasm of breast: Secondary | ICD-10-CM

## 2019-07-01 ENCOUNTER — Other Ambulatory Visit: Payer: Self-pay | Admitting: Family Medicine

## 2019-07-01 DIAGNOSIS — R928 Other abnormal and inconclusive findings on diagnostic imaging of breast: Secondary | ICD-10-CM

## 2019-07-09 ENCOUNTER — Other Ambulatory Visit: Payer: BC Managed Care – PPO

## 2019-07-15 ENCOUNTER — Ambulatory Visit: Payer: 59

## 2019-07-15 ENCOUNTER — Other Ambulatory Visit: Payer: Self-pay

## 2019-07-15 ENCOUNTER — Ambulatory Visit
Admission: RE | Admit: 2019-07-15 | Discharge: 2019-07-15 | Disposition: A | Discharge status: Home / Self Care | Payer: 59 | Source: Ambulatory Visit | Attending: Family Medicine | Admitting: Family Medicine

## 2019-07-15 DIAGNOSIS — R928 Other abnormal and inconclusive findings on diagnostic imaging of breast: Secondary | ICD-10-CM

## 2019-10-12 NOTE — Progress Notes (Signed)
54 y.o. [id number] Married White or Caucasian female here for annual exam.  Got vaccinated for Covid.  Still living in [place].  Got married January 1st!  Has committed to [employer] school for one more year.  Not sure she will continue after that time.    Denies vaginal bleeding.  Feeling good with the estradiol patch.    Patient's last menstrual period was 04/05/2012.          Sexually active: Yes.    The current method of family planning is status post hysterectomy.    Exercising: No.  exercise Smoker:  no  Health Maintenance: Pap:  11-25-11 neg History of abnormal Pap:  no MMG:  06-30-2019 bilateral & 07-15-2019 left breast category b density birads 1:neg Colonoscopy:  2011 f/u 66yrs BMD:   2018 osteopenia TDaP:  2015 Pneumonia vaccine(s):  Not done Shingrix:   Not done Hep C testing: Not done Screening Labs: 09/24/2018   reports that she has never smoked. She has never used smokeless tobacco. She reports current alcohol use of about 7.0 standard drinks of alcohol per week. She reports that she does not use drugs.  Past Medical History:  Diagnosis Date   Anemia    Anxiety    Depression    situational   Headache(784.0)    migraines   Menorrhagia     Past Surgical History:  Procedure Laterality Date   BILATERAL SALPINGECTOMY  04/21/2012   Procedure: BILATERAL SALPINGECTOMY;  Surgeon: Peri Maris, MD;  Location: [facility];  Service: Gynecology;  Laterality: Bilateral;   LAPAROSCOPIC ASSISTED VAGINAL HYSTERECTOMY  04/21/2012   Procedure: LAPAROSCOPIC ASSISTED VAGINAL HYSTERECTOMY;  Surgeon: Peri Maris, MD;  Location: [facility];  Service: Gynecology;  Laterality: N/A;    Current Outpatient Medications  Medication Sig Dispense Refill   Cholecalciferol (VITAMIN D-3 PO) Take 3,000 Units by mouth daily.     estradiol (VIVELLE-DOT) 0.05 MG/24HR patch Place 1 patch (0.05 mg total) onto the skin 2 (two) times a week. 24 patch 4   tretinoin (RETIN-A) 0.05 % cream as  needed.      UNABLE TO FIND rx face cream for rosacea     No current facility-administered medications for this visit.    Family History  Problem Relation Age of Onset   Breast cancer Maternal Aunt    Breast cancer Maternal Grandmother    Breast cancer Cousin    Hemochromatosis Brother     Review of Systems  Constitutional: Negative.   HENT: Negative.   Eyes: Negative.   Respiratory: Negative.   Cardiovascular: Negative.   Gastrointestinal: Negative.   Endocrine: Negative.   Genitourinary: Negative.   Musculoskeletal: Negative.   Skin: Negative.   Allergic/Immunologic: Negative.   Neurological: Negative.   Hematological: Negative.   Psychiatric/Behavioral: Negative.     Exam:   BP 110/70    Pulse 68    Temp (!) 97.5 F (36.4 C) (Skin)    Resp 16    Ht 5' 2.5" (1.588 m)    Wt 131 lb (59.4 kg)    LMP 04/05/2012    BMI 23.58 kg/m   Height: 5' 2.5" (158.8 cm)  General appearance: alert, cooperative and appears stated age Head: Normocephalic, without obvious abnormality, atraumatic Neck: no adenopathy, supple, symmetrical, trachea midline and thyroid normal to inspection and palpation Lungs: clear to auscultation bilaterally Breasts: normal appearance, no masses or tenderness Heart: regular rate and rhythm Abdomen: soft, non-tender; bowel sounds normal; no masses,  no organomegaly Extremities: extremities normal,  atraumatic, no cyanosis or edema Skin: Skin color, texture, turgor normal. No rashes or lesions Lymph nodes: Cervical, supraclavicular, and axillary nodes normal. No abnormal inguinal nodes palpated Neurologic: Grossly normal   Pelvic: External genitalia:  no lesions              Urethra:  normal appearing urethra with no masses, tenderness or lesions              Bartholins and Skenes: normal                 Vagina: normal appearing vagina with normal color and discharge, no lesions              Cervix: absent              Pap taken: No. Bimanual  Exam:  Uterus:  uterus absent              Adnexa: no mass, fullness, tenderness               Rectovaginal: pt declined rectal exam               Anus:  no visible lesions  Chaperone, Terence Lux, CMA, was present for exam.  A:  Well Woman with normal exam H/o LAVH with salpingectomy 12/13 Osteopenia On HRT Family hx of breast cancer in [place], M aunt, cousin Osteopenia  P:   Mammogram guidelines reviewed.  Doing yearly exam. pap smear not indicated  Declines lab work today Information about GI referral to [facility] given to pt today.  She will call if desires referral. Plan BMD with MMG 06/2020.  Order placed.   RF for vivelle dot 0.05mg  patches twice weekly.  #24/4RF. Shingrix vaccination discussed Return annually or prn

## 2019-10-13 ENCOUNTER — Other Ambulatory Visit: Payer: Self-pay

## 2019-10-14 ENCOUNTER — Encounter: Payer: Self-pay | Admitting: Obstetrics & Gynecology

## 2019-10-14 ENCOUNTER — Ambulatory Visit: Payer: 59 | Admitting: Obstetrics & Gynecology

## 2019-10-14 VITALS — BP 110/70 | HR 68 | Temp 97.5°F | Resp 16 | Ht 62.5 in | Wt 131.0 lb

## 2019-10-14 DIAGNOSIS — M8580 Other specified disorders of bone density and structure, unspecified site: Secondary | ICD-10-CM | POA: Diagnosis not present

## 2019-10-14 DIAGNOSIS — Z01419 Encounter for gynecological examination (general) (routine) without abnormal findings: Secondary | ICD-10-CM | POA: Diagnosis not present

## 2019-10-14 MED ORDER — ESTRADIOL 0.05 MG/24HR TD PTTW: 1.0000 | MEDICATED_PATCH | TRANSDERMAL | 4 refills | Status: DC

## 2019-10-14 NOTE — Patient Instructions (Signed)
Dr. Elspeth [facility] Gastrointestinal Procedures [facility] GI Procedures [facility]) [address] [place]. [place], [place]  [address] Driving Directions   [phone]

## 2020-03-23 ENCOUNTER — Encounter: Payer: Self-pay | Admitting: Obstetrics & Gynecology

## 2020-05-24 ENCOUNTER — Other Ambulatory Visit: Payer: Self-pay | Admitting: Obstetrics & Gynecology

## 2020-05-24 DIAGNOSIS — Z1231 Encounter for screening mammogram for malignant neoplasm of breast: Secondary | ICD-10-CM

## 2020-05-25 ENCOUNTER — Other Ambulatory Visit: Payer: 59

## 2020-06-08 ENCOUNTER — Encounter (HOSPITAL_BASED_OUTPATIENT_CLINIC_OR_DEPARTMENT_OTHER): Payer: Self-pay

## 2020-07-14 ENCOUNTER — Encounter (HOSPITAL_BASED_OUTPATIENT_CLINIC_OR_DEPARTMENT_OTHER): Payer: Self-pay

## 2020-07-14 NOTE — Telephone Encounter (Signed)
Tarshia, I think this was written before I spoke with her but will you double check if she's coming to get this or needs it faxed?  Thank you.  Edwinna Areola

## 2020-09-02 ENCOUNTER — Encounter (HOSPITAL_BASED_OUTPATIENT_CLINIC_OR_DEPARTMENT_OTHER): Payer: Self-pay

## 2020-09-04 MED ORDER — ESTRADIOL 0.05 MG/24HR TD PTTW: 1.0000 | MEDICATED_PATCH | TRANSDERMAL | 0 refills | Status: DC

## 2020-09-20 ENCOUNTER — Ambulatory Visit
Admission: RE | Admit: 2020-09-20 | Discharge: 2020-09-20 | Disposition: A | Discharge status: Home / Self Care | Payer: 59 | Source: Ambulatory Visit | Attending: Obstetrics & Gynecology | Admitting: Obstetrics & Gynecology

## 2020-09-20 ENCOUNTER — Other Ambulatory Visit: Payer: Self-pay

## 2020-09-20 DIAGNOSIS — Z1231 Encounter for screening mammogram for malignant neoplasm of breast: Secondary | ICD-10-CM

## 2020-09-20 DIAGNOSIS — M8580 Other specified disorders of bone density and structure, unspecified site: Secondary | ICD-10-CM

## 2020-10-19 ENCOUNTER — Encounter (HOSPITAL_BASED_OUTPATIENT_CLINIC_OR_DEPARTMENT_OTHER): Payer: Self-pay | Admitting: Obstetrics & Gynecology

## 2020-10-19 ENCOUNTER — Other Ambulatory Visit: Payer: Self-pay

## 2020-10-19 ENCOUNTER — Ambulatory Visit (INDEPENDENT_AMBULATORY_CARE_PROVIDER_SITE_OTHER): Payer: 59 | Admitting: Obstetrics & Gynecology

## 2020-10-19 VITALS — BP 103/71 | HR 55 | Ht 62.0 in | Wt 129.0 lb

## 2020-10-19 DIAGNOSIS — Z79890 Hormone replacement therapy (postmenopausal): Secondary | ICD-10-CM

## 2020-10-19 DIAGNOSIS — Z803 Family history of malignant neoplasm of breast: Secondary | ICD-10-CM

## 2020-10-19 DIAGNOSIS — M8580 Other specified disorders of bone density and structure, unspecified site: Secondary | ICD-10-CM

## 2020-10-19 DIAGNOSIS — Z01419 Encounter for gynecological examination (general) (routine) without abnormal findings: Secondary | ICD-10-CM

## 2020-10-19 DIAGNOSIS — G43009 Migraine without aura, not intractable, without status migrainosus: Secondary | ICD-10-CM | POA: Insufficient documentation

## 2020-10-19 DIAGNOSIS — Z90710 Acquired absence of both cervix and uterus: Secondary | ICD-10-CM | POA: Diagnosis not present

## 2020-10-19 MED ORDER — ESTRADIOL 0.05 MG/24HR TD PTTW: 1.0000 | MEDICATED_PATCH | TRANSDERMAL | 4 refills | Status: DC

## 2020-10-19 NOTE — Patient Instructions (Signed)
Debra Caldwell [address] [place], [place] [address]  [name]  Phone: [phone]

## 2020-10-19 NOTE — Progress Notes (Signed)
55 y.o. [id number] Married White or Caucasian female here for annual exam.  Doing well.  Kids are all doing well.  This is her last year at [facility].  Living in [place].    Patient's last menstrual period was 04/05/2012.          Sexually active: Yes.    The current method of family planning is status post hysterectomy.    Smoker:  no  Health Maintenance: Pap:  04/21/12 Hysterectomy  History of abnormal Pap:  no MMG:  09/20/20 Colonoscopy:  2011.  Pt aware this is due.   BMD:   09/20/20, -2.0 TDaP:  2015 Shingrix:   reviewed.  Pt is going to do this in [place] C testing: recommend doing with blood work Screening Labs: 2021   reports that she has never smoked. She has never used smokeless tobacco. She reports current alcohol use of about 7.0 standard drinks of alcohol per week. She reports that she does not use drugs.  Past Medical History:  Diagnosis Date   Anxiety    Depression    situational   Headache(784.0)    migraines    Past Surgical History:  Procedure Laterality Date   BILATERAL SALPINGECTOMY  04/21/2012   Procedure: BILATERAL SALPINGECTOMY;  Surgeon: Peri Maris, MD;  Location: [facility];  Service: Gynecology;  Laterality: Bilateral;   LAPAROSCOPIC ASSISTED VAGINAL HYSTERECTOMY  04/21/2012   Procedure: LAPAROSCOPIC ASSISTED VAGINAL HYSTERECTOMY;  Surgeon: Peri Maris, MD;  Location: [facility];  Service: Gynecology;  Laterality: N/A;    Current Outpatient Medications  Medication Sig Dispense Refill   Cholecalciferol (VITAMIN D-3 PO) Take 3,000 Units by mouth daily.     estradiol (VIVELLE-DOT) 0.05 MG/24HR patch Place 1 patch (0.05 mg total) onto the skin 2 (two) times a week. 24 patch 0   tretinoin (RETIN-A) 0.05 % cream as needed.      UNABLE TO FIND rx face cream for rosacea     No current facility-administered medications for this visit.    Family History  Problem Relation Age of Onset   Breast cancer Maternal Aunt    Breast cancer Maternal  Grandmother    Breast cancer Cousin    Hemochromatosis Brother     Review of Systems  All other systems reviewed and are negative.  Exam:   BP 103/71   Pulse (!) 55   Ht 5\' 2"  (1.575 m)   Wt 129 lb (58.5 kg)   LMP 04/05/2012   BMI 23.59 kg/m   Height: 5\' 2"  (157.5 cm)  General appearance: alert, cooperative and appears stated age Head: Normocephalic, without obvious abnormality, atraumatic Neck: no adenopathy, supple, symmetrical, trachea midline and thyroid normal to inspection and palpation Lungs: clear to auscultation bilaterally Breasts: normal appearance, no masses or tenderness Heart: regular rate and rhythm Abdomen: soft, non-tender; bowel sounds normal; no masses,  no organomegaly Extremities: extremities normal, atraumatic, no cyanosis or edema Skin: Skin color, texture, turgor normal. No rashes or lesions Lymph nodes: Cervical, supraclavicular, and axillary nodes normal. No abnormal inguinal nodes palpated Neurologic: Grossly normal   Pelvic: External genitalia:  no lesions              Urethra:  normal appearing urethra with no masses, tenderness or lesions              Bartholins and Skenes: normal                 Vagina: normal appearing vagina with normal color and  no discharge, no lesions              Cervix: absent              Pap taken: No. Bimanual Exam:  Uterus:  uterus absent              Adnexa: no mass, fullness, tenderness               Rectovaginal: Confirms               Anus:  normal sphincter tone, no lesions  Chaperone, Octaviano Batty, CMA, was present for exam.  Assessment/Plan: 1. Well woman exam with routine gynecological exam - pap not indicated - MMG 09/2020 - BMD 09/2020 - labs done last year - she is going to research local GI providers and let me know if needs referral - vaccine updated  2. H/O: hysterectomy  3. Hormone replacement therapy (HRT) - rx for estradiol 0.05mg  patches twice weekly.  #24/4RF  4. Osteopenia,  unspecified location - plan to repeat in 2 years  5. Family history of breast cancer - Tyrer Cusick model today with 11.3% lifetime risk.  Limited breast MRI discussed today.

## 2020-10-20 ENCOUNTER — Encounter (HOSPITAL_BASED_OUTPATIENT_CLINIC_OR_DEPARTMENT_OTHER): Payer: Self-pay

## 2020-10-21 ENCOUNTER — Other Ambulatory Visit (HOSPITAL_BASED_OUTPATIENT_CLINIC_OR_DEPARTMENT_OTHER): Payer: Self-pay | Admitting: Obstetrics & Gynecology

## 2020-10-21 MED ORDER — ESTRADIOL 0.05 MG/24HR TD PTTW: 1.0000 | MEDICATED_PATCH | TRANSDERMAL | 4 refills | Status: DC

## 2020-10-29 ENCOUNTER — Other Ambulatory Visit (HOSPITAL_BASED_OUTPATIENT_CLINIC_OR_DEPARTMENT_OTHER): Payer: Self-pay | Admitting: Obstetrics & Gynecology

## 2020-10-31 ENCOUNTER — Telehealth: Payer: 59 | Admitting: Physician Assistant

## 2020-10-31 DIAGNOSIS — R300 Dysuria: Secondary | ICD-10-CM

## 2020-10-31 MED ORDER — CEPHALEXIN 500 MG PO CAPS: 500.0000 mg | ORAL_CAPSULE | Freq: Two times a day (BID) | ORAL | 0 refills | Status: AC

## 2020-10-31 NOTE — Progress Notes (Signed)
I have spent 5 minutes in review of e-visit questionnaire, review and updating patient chart, medical decision making and response to patient.   Diamonds Lippard Cody Chinedu Agustin, PA-C    

## 2020-10-31 NOTE — Progress Notes (Signed)

## 2020-10-31 NOTE — Telephone Encounter (Signed)
LMOVM in regards to a prescription refill request

## 2020-11-01 NOTE — Telephone Encounter (Signed)
Pt called back. She does not need a prescription sent to [facility]. She picked it up from [facility].

## 2020-11-01 NOTE — Telephone Encounter (Signed)
LMOVM in regards to prescription refill request

## 2020-12-04 ENCOUNTER — Ambulatory Visit: Payer: 59

## 2021-02-02 ENCOUNTER — Encounter (HOSPITAL_BASED_OUTPATIENT_CLINIC_OR_DEPARTMENT_OTHER): Payer: Self-pay

## 2021-02-05 ENCOUNTER — Other Ambulatory Visit (HOSPITAL_BASED_OUTPATIENT_CLINIC_OR_DEPARTMENT_OTHER): Payer: Self-pay | Admitting: Obstetrics & Gynecology

## 2021-02-05 MED ORDER — ESTRADIOL 0.5 MG PO TABS: 0.5000 mg | ORAL_TABLET | Freq: Every day | ORAL | 3 refills | Status: DC

## 2021-02-06 ENCOUNTER — Other Ambulatory Visit (HOSPITAL_BASED_OUTPATIENT_CLINIC_OR_DEPARTMENT_OTHER): Payer: Self-pay | Admitting: *Deleted

## 2021-02-06 MED ORDER — ESTRADIOL 0.5 MG PO TABS
0.5000 mg | ORAL_TABLET | Freq: Every day | ORAL | 3 refills | Status: DC
Start: 2021-02-06 — End: 2021-02-21

## 2021-02-21 ENCOUNTER — Other Ambulatory Visit (HOSPITAL_BASED_OUTPATIENT_CLINIC_OR_DEPARTMENT_OTHER): Payer: Self-pay | Admitting: *Deleted

## 2021-02-21 MED ORDER — ESTRADIOL 0.5 MG PO TABS: 0.5000 mg | ORAL_TABLET | Freq: Every day | ORAL | 3 refills | Status: DC

## 2021-05-23 ENCOUNTER — Encounter (HOSPITAL_BASED_OUTPATIENT_CLINIC_OR_DEPARTMENT_OTHER): Payer: Self-pay | Admitting: Obstetrics & Gynecology

## 2021-08-31 ENCOUNTER — Other Ambulatory Visit: Payer: Self-pay | Admitting: Obstetrics & Gynecology

## 2021-08-31 DIAGNOSIS — Z1231 Encounter for screening mammogram for malignant neoplasm of breast: Secondary | ICD-10-CM

## 2021-10-09 ENCOUNTER — Encounter (HOSPITAL_BASED_OUTPATIENT_CLINIC_OR_DEPARTMENT_OTHER): Payer: Self-pay | Admitting: Obstetrics & Gynecology

## 2021-10-19 ENCOUNTER — Other Ambulatory Visit (HOSPITAL_BASED_OUTPATIENT_CLINIC_OR_DEPARTMENT_OTHER): Payer: Self-pay | Admitting: Obstetrics & Gynecology

## 2021-10-19 DIAGNOSIS — Z0000 Encounter for general adult medical examination without abnormal findings: Secondary | ICD-10-CM

## 2021-10-22 ENCOUNTER — Ambulatory Visit
Admission: RE | Admit: 2021-10-22 | Discharge: 2021-10-22 | Disposition: A | Discharge status: Home / Self Care | Payer: BC Managed Care – PPO | Source: Ambulatory Visit | Attending: Obstetrics & Gynecology | Admitting: Obstetrics & Gynecology

## 2021-10-22 ENCOUNTER — Encounter (HOSPITAL_BASED_OUTPATIENT_CLINIC_OR_DEPARTMENT_OTHER): Payer: Self-pay | Admitting: Obstetrics & Gynecology

## 2021-10-22 ENCOUNTER — Other Ambulatory Visit (HOSPITAL_BASED_OUTPATIENT_CLINIC_OR_DEPARTMENT_OTHER): Payer: Self-pay

## 2021-10-22 ENCOUNTER — Ambulatory Visit (INDEPENDENT_AMBULATORY_CARE_PROVIDER_SITE_OTHER): Payer: BC Managed Care – PPO | Admitting: Obstetrics & Gynecology

## 2021-10-22 VITALS — BP 113/79 | HR 63 | Ht 62.0 in | Wt 129.4 lb

## 2021-10-22 DIAGNOSIS — N62 Hypertrophy of breast: Secondary | ICD-10-CM

## 2021-10-22 DIAGNOSIS — D170 Benign lipomatous neoplasm of skin and subcutaneous tissue of head, face and neck: Secondary | ICD-10-CM | POA: Diagnosis not present

## 2021-10-22 DIAGNOSIS — Z79890 Hormone replacement therapy (postmenopausal): Secondary | ICD-10-CM | POA: Diagnosis not present

## 2021-10-22 DIAGNOSIS — Z01419 Encounter for gynecological examination (general) (routine) without abnormal findings: Secondary | ICD-10-CM

## 2021-10-22 DIAGNOSIS — Z0000 Encounter for general adult medical examination without abnormal findings: Secondary | ICD-10-CM

## 2021-10-22 DIAGNOSIS — Z1159 Encounter for screening for other viral diseases: Secondary | ICD-10-CM

## 2021-10-22 DIAGNOSIS — Z803 Family history of malignant neoplasm of breast: Secondary | ICD-10-CM

## 2021-10-22 DIAGNOSIS — Z1231 Encounter for screening mammogram for malignant neoplasm of breast: Secondary | ICD-10-CM

## 2021-10-22 MED ORDER — ESTRADIOL 0.5 MG PO TABS: 0.5000 mg | ORAL_TABLET | Freq: Every day | ORAL | 3 refills | Status: DC

## 2021-10-22 NOTE — Progress Notes (Signed)
Pt here for labs  

## 2021-10-22 NOTE — Patient Instructions (Signed)
Plastic Surgeons:  Audelia Hives [facility]), Frederick Peers ([facility])

## 2021-10-22 NOTE — Progress Notes (Signed)
56 y.o. Debra Caldwell Married White or Caucasian female here for annual exam.  Doing well.  Denies vaginal bleeding.  H/o LAVH 04/2012.    Has lipoma near left clavicle.  Is living in [place] now.  Was sent for ultrasound and was advised this is a lipoma.  Now, she has tenderness with the lesion when wears a sports bra and it has enlarged.  She was referred to specialist in [place] but was never given an appt.  Would come back to [facility] and see surgeon to have this done.    Pt frustrated with size of breasts.  They seem to be getting larger.  This happened to her mother.  Has some neck and back pain.  Has taken anti-inflammatories.  Uses heat as well.  Wears 36D but she is very small framed.  Uncomfortable with size of breasts.    Patient's last menstrual period was 04/05/2012.          Sexually active: Yes.    The current method of family planning is status post hysterectomy.    Exercising: Yes.     Smoker:  no  Health Maintenance: Pap:  not indicated History of abnormal Pap:  no MMG:  09/20/2020 Negative Colonoscopy:  12/2020 negative.  Follow up 10 years.   BMD:   09/20/2020 Osteopenia, -2.0 Screening Labs: Done this morning   reports that she has never smoked. She has never used smokeless tobacco. She reports current alcohol use of about 7.0 standard drinks of alcohol per week. She reports that she does not use drugs.  Past Medical History:  Diagnosis Date   Anxiety    Depression    situational   Headache(784.0)    migraines    Past Surgical History:  Procedure Laterality Date   BILATERAL SALPINGECTOMY  04/21/2012   Procedure: BILATERAL SALPINGECTOMY;  Surgeon: Peri Maris, MD;  Location: [place];  Service: Gynecology;  Laterality: Bilateral;   LAPAROSCOPIC ASSISTED VAGINAL HYSTERECTOMY  04/21/2012   Procedure: LAPAROSCOPIC ASSISTED VAGINAL HYSTERECTOMY;  Surgeon: Peri Maris, MD;  Location: [facility];  Service: Gynecology;  Laterality: N/A;    Current Outpatient  Medications  Medication Sig Dispense Refill   Cholecalciferol (VITAMIN D-3 PO) Take 3,000 Units by mouth daily.     estradiol (ESTRACE) 0.5 MG tablet Take 1 tablet (0.5 mg total) by mouth daily. 90 tablet 3   tretinoin (RETIN-A) 0.05 % cream as needed.      UNABLE TO FIND rx face cream for rosacea     No current facility-administered medications for this visit.    Family History  Problem Relation Age of Onset   Breast cancer Maternal Aunt    Breast cancer Maternal Grandmother    Breast cancer Cousin    Hemochromatosis Brother    GYN: Genitourinary:negative  Exam:   BP 113/79 (BP Location: Left Arm, Patient Position: Sitting, Cuff Size: Large)   Pulse 63   Ht '5\' 2"'$  (1.575 m) Comment: Reported  Wt 129 lb 6.4 oz (58.7 kg)   LMP 04/05/2012   BMI 23.67 kg/m   Height: '5\' 2"'$  (157.5 cm) (Reported)  General appearance: alert, cooperative and appears stated age Head: Normocephalic, without obvious abnormality, atraumatic Neck: no adenopathy, supple, symmetrical, trachea midline and thyroid normal to inspection and palpation,  Lungs: clear to auscultation bilaterally Breasts: normal appearance, no masses or tenderness Heart: regular rate and rhythm Abdomen: soft, non-tender; bowel sounds normal; no masses,  no organomegaly Extremities: extremities normal, atraumatic, no cyanosis or edema Skin: Skin  color, texture, turgor normal. No rashes or lesions Lymph nodes: Cervical, supraclavicular, and axillary nodes normal. No abnormal inguinal nodes palpated Neurologic: Grossly normal   Pelvic: External genitalia:  no lesions              Urethra:  normal appearing urethra with no masses, tenderness or lesions              Bartholins and Skenes: normal                 Vagina: normal appearing vagina with normal color and no discharge, no lesions              Cervix: absent              Pap taken: No. Bimanual Exam:  Uterus:  uterus absent              Adnexa: no mass, fullness,  tenderness               Rectovaginal: Confirms               Anus:  normal sphincter tone, no lesions  Chaperone, Octaviano Batty, CMA, was present for exam.  Assessment/Plan: 1. Well woman exam with routine gynecological exam - pap is not indicated - MMG done this morning - colonoscopy negative 2022 - BMD done last year - vaccines reviewed/updated - lab work done this morning  2. Lipoma of neck - Ambulatory referral to General Surgery  3. Postmenopausal HRT (hormone replacement therapy) - estradiol (ESTRACE) 0.5 MG tablet; Take 1 tablet (0.5 mg total) by mouth daily.  Dispense: 90 tablet; Refill: 3  4. Macromastia - names of plastic surgeons given to pt  5. Family history of breast cancer - Had Tyrer Cusick model done with risk of 11.3% lifetime risk

## 2021-10-23 LAB — CBC
Hematocrit: 41.4 % (ref 34.0–46.6)
Hemoglobin: 13.6 g/dL (ref 11.1–15.9)
MCH: 30.9 pg (ref 26.6–33.0)
MCHC: 32.9 g/dL (ref 31.5–35.7)
MCV: 94 fL (ref 79–97)
Platelets: 308 10*3/uL (ref 150–450)
RBC: 4.4 x10E6/uL (ref 3.77–5.28)
RDW: 13.1 % (ref 11.7–15.4)
WBC: 6.5 10*3/uL (ref 3.4–10.8)

## 2021-10-23 LAB — COMPREHENSIVE METABOLIC PANEL
ALT: 13 IU/L (ref 0–32)
AST: 18 IU/L (ref 0–40)
Albumin/Globulin Ratio: 1.8 (ref 1.2–2.2)
Albumin: 4.2 g/dL (ref 3.8–4.9)
Alkaline Phosphatase: 74 IU/L (ref 44–121)
BUN/Creatinine Ratio: 18 (ref 9–23)
BUN: 14 mg/dL (ref 6–24)
Bilirubin Total: <0.2 mg/dL (ref 0.0–1.2)
CO2: 21 mmol/L (ref 20–29)
Calcium: 8.9 mg/dL (ref 8.7–10.2)
Chloride: 107 mmol/L — ABNORMAL HIGH (ref 96–106)
Creatinine, Ser: 0.78 mg/dL (ref 0.57–1.00)
Globulin, Total: 2.4 g/dL (ref 1.5–4.5)
Glucose: 93 mg/dL (ref 70–99)
Potassium: 4.5 mmol/L (ref 3.5–5.2)
Sodium: 144 mmol/L (ref 134–144)
Total Protein: 6.6 g/dL (ref 6.0–8.5)
eGFR: 90 mL/min/{1.73_m2} (ref >59)

## 2021-10-23 LAB — LIPID PANEL
Chol/HDL Ratio: 4.3 ratio (ref 0.0–4.4)
Cholesterol, Total: 270 mg/dL — ABNORMAL HIGH (ref 100–199)
HDL: 63 mg/dL (ref >39)
LDL Chol Calc (NIH): 183 mg/dL — ABNORMAL HIGH (ref 0–99)
Triglycerides: 136 mg/dL (ref 0–149)
VLDL Cholesterol Cal: 24 mg/dL (ref 5–40)

## 2021-10-23 LAB — TSH: TSH: 1.84 u[IU]/mL (ref 0.450–4.500)

## 2021-10-23 LAB — HEMOGLOBIN A1C
Est. average glucose Bld gHb Est-mCnc: 117 mg/dL
Hgb A1c MFr Bld: 5.7 % — ABNORMAL HIGH (ref 4.8–5.6)

## 2021-10-25 LAB — SPECIMEN STATUS REPORT

## 2021-10-25 LAB — HEPATITIS C ANTIBODY: Hep C Virus Ab: NONREACTIVE

## 2021-10-26 ENCOUNTER — Other Ambulatory Visit (HOSPITAL_BASED_OUTPATIENT_CLINIC_OR_DEPARTMENT_OTHER): Payer: Self-pay | Admitting: Obstetrics & Gynecology

## 2021-10-26 DIAGNOSIS — E785 Hyperlipidemia, unspecified: Secondary | ICD-10-CM

## 2021-11-27 ENCOUNTER — Ambulatory Visit (INDEPENDENT_AMBULATORY_CARE_PROVIDER_SITE_OTHER): Payer: BC Managed Care – PPO | Admitting: Cardiology

## 2021-11-27 VITALS — BP 100/68 | HR 72 | Ht 62.0 in | Wt 131.0 lb

## 2021-11-27 DIAGNOSIS — E7800 Pure hypercholesterolemia, unspecified: Secondary | ICD-10-CM

## 2021-11-27 DIAGNOSIS — Z8249 Family history of ischemic heart disease and other diseases of the circulatory system: Secondary | ICD-10-CM

## 2021-11-27 DIAGNOSIS — Z7189 Other specified counseling: Secondary | ICD-10-CM | POA: Diagnosis not present

## 2021-11-27 NOTE — Patient Instructions (Signed)
Medication Instructions:  Your Physician recommend you continue on your current medication as directed.    *If you need a refill on your cardiac medications before your next appointment, please call your pharmacy*   Lab Work: None ordered today   Testing/Procedures: CT coronary calcium score.   Test locations:  [facility]   This is $99 out of pocket.   Coronary CalciumScan A coronary calcium scan is an imaging test used to look for deposits of calcium and other fatty materials (plaques) in the inner lining of the blood vessels of the heart (coronary arteries). These deposits of calcium and plaques can partly clog and narrow the coronary arteries without producing any symptoms or warning signs. This puts a person at risk for a heart attack. This test can detect these deposits before symptoms develop. Tell a health care provider about: Any allergies you have. All medicines you are taking, including vitamins, herbs, eye drops, creams, and over-the-counter medicines. Any problems you or family members have had with anesthetic medicines. Any blood disorders you have. Any surgeries you have had. Any medical conditions you have. Whether you are pregnant or may be pregnant. What are the risks? Generally, this is a safe procedure. However, problems may occur, including: Harm to a pregnant woman and her unborn baby. This test involves the use of radiation. Radiation exposure can be dangerous to a pregnant woman and her unborn baby. If you are pregnant, you generally should not have this procedure done. Slight increase in the risk of cancer. This is because of the radiation involved in the test. What happens before the procedure? No preparation is needed for this procedure. What happens during the procedure? You will undress and remove any jewelry around your neck or chest. You will put on a hospital gown. Sticky electrodes will be placed on your chest. The electrodes will be  connected to an electrocardiogram (ECG) machine to record a tracing of the electrical activity of your heart. A CT scanner will take pictures of your heart. During this time, you will be asked to lie still and hold your breath for 2-3 seconds while a picture of your heart is being taken. The procedure may vary among health care providers and hospitals. What happens after the procedure? You can get dressed. You can return to your normal activities. It is up to you to get the results of your test. Ask your health care provider, or the department that is doing the test, when your results will be ready. Summary A coronary calcium scan is an imaging test used to look for deposits of calcium and other fatty materials (plaques) in the inner lining of the blood vessels of the heart (coronary arteries). Generally, this is a safe procedure. Tell your health care provider if you are pregnant or may be pregnant. No preparation is needed for this procedure. A CT scanner will take pictures of your heart. You can return to your normal activities after the scan is done. This information is not intended to replace advice given to you by your health care provider. Make sure you discuss any questions you have with your health care provider. Document Released: 10/19/2007 Document Revised: 03/11/2016 Document Reviewed: 03/11/2016 Elsevier Interactive Patient Education  2017 [place]: At [facility], you and your health needs are our priority.  As part of our continuing mission to provide you with exceptional heart care, we have created designated Provider Care Teams.  These Care Teams include your primary Cardiologist (  physician) and Advanced Practice Providers (APPs -  Physician Assistants and Nurse Practitioners) who all work together to provide you with the care you need, when you need it.  We recommend signing up for the patient portal called "MyChart".  Sign up information is provided on  this After Visit Summary.  MyChart is used to connect with patients for Virtual Visits (Telemedicine).  Patients are able to view lab/test results, encounter notes, upcoming appointments, etc.  Non-urgent messages can be sent to your provider as well.   To learn more about what you can do with MyChart, go to [email or web address].    Your next appointment:   2 year(s)  The format for your next appointment:   In Person  Provider:   Buford Dresser, MD{      '

## 2021-11-27 NOTE — Progress Notes (Signed)
Cardiology Office Note:    Date:  11/27/2021   ID:  Debra Caldwell, DOB [birth year], MRN [id number]  PCP:  Cari Caraway, MD  Cardiologist:  Buford Dresser, MD  Referring MD: Megan Salon, MD   CC: new patient consultation for elevated lipids  History of Present Illness:    Debra Caldwell is a 56 y.o. female with a hx of depression, and anxiety, who is seen as a new consult at the request of Megan Salon, MD for the evaluation and management of elevated lipids.  She saw Dr. Sabra Heck on 10/22/21. Lab work was ordered; personally reviewed today. This showed LDL 183, HDL 63, triglycerides 136, A1C 5.7, creatinine 0.78.  Cardiovascular risk factors: Prior clinical ASCVD: None. Comorbid conditions: Usually has low blood pressure. Metabolic syndrome/Obesity: Highest adult weight 136 lbs, currently 131 lbs. Chronic inflammatory conditions: None. Tobacco use history: Never. Family history: Her maternal grandfather died at 1 yo due to second or third heart attack. Her maternal uncle died at 66 yo due to heart attack. Her grandmother had CABG x5, died in her 24's. Her mother had a stent placed about 10-12 years ago. She has 4 brothers, all on cholesterol medication, no heart attacks. Her maternal aunt had atrial fibrillation. No known heart disease on her paternal side. Prior cardiac testing and/or incidental findings on other testing (ie coronary calcium): None. Exercise level: Walked for 1 hour this morning. Typically active 8000 steps daily, when walking for exercise about 12000 steps (3-4 days a week).  Current diet: Vegetarian for 10-12 years. Protein sources: protein shake, 1/2 cup cottage cheese, nonfat Greek yogurt, tofu, salmon, tuna fish, bean salad, eggs.  Once she became short of breath when walking outside in the heat. Otherwise she would deny any significant exertional symptoms.  Remotely, one day she experienced an episode of severe dizziness while working.  This did not recur.  She states that she bruises and bleeds more easily than she used to.  She denies any palpitations, chest pain, or peripheral edema. No headaches, syncope, orthopnea, or PND.  Past Medical History:  Diagnosis Date   Anxiety    Depression    situational   Headache(784.0)    migraines    Past Surgical History:  Procedure Laterality Date   BILATERAL SALPINGECTOMY  04/21/2012   Procedure: BILATERAL SALPINGECTOMY;  Surgeon: Peri Maris, MD;  Location: [facility];  Service: Gynecology;  Laterality: Bilateral;   LAPAROSCOPIC ASSISTED VAGINAL HYSTERECTOMY  04/21/2012   Procedure: LAPAROSCOPIC ASSISTED VAGINAL HYSTERECTOMY;  Surgeon: Peri Maris, MD;  Location: [facility];  Service: Gynecology;  Laterality: N/A;    Current Medications: Current Outpatient Medications on File Prior to Visit  Medication Sig   Cholecalciferol (VITAMIN D-3 PO) Take 3,000 Units by mouth daily.   estradiol (ESTRACE) 0.5 MG tablet Take 1 tablet (0.5 mg total) by mouth daily.   tretinoin (RETIN-A) 0.05 % cream as needed.    UNABLE TO FIND rx face cream for rosacea   No current facility-administered medications on file prior to visit.     Allergies:   Patient has no known allergies.   Social History   Tobacco Use   Smoking status: Never   Smokeless tobacco: Never  Vaping Use   Vaping Use: Never used  Substance Use Topics   Alcohol use: Yes    Alcohol/week: 7.0 standard drinks of alcohol    Types: 7 Standard drinks or equivalent per week   Drug use: No    Family  History: family history includes Breast cancer in her cousin, maternal aunt, and maternal grandmother; Hemochromatosis in her brother.  ROS:   Please see the history of present illness.  Additional pertinent ROS: Constitutional: Negative for chills, fever, night sweats, unintentional weight loss  HENT: Negative for ear pain and hearing loss.   Eyes: Negative for loss of vision and eye pain.  Respiratory: Negative  for cough, sputum, wheezing.   Cardiovascular: See HPI. Gastrointestinal: Negative for abdominal pain, melena, and hematochezia.  Genitourinary: Negative for dysuria and hematuria.  Musculoskeletal: Negative for falls and myalgias.  Skin: Negative for itching and rash.  Neurological: Negative for focal weakness, focal sensory changes and loss of consciousness.  Endo/Heme/Allergies: Does bruise/bleed easily.     EKGs/Labs/Other Studies Reviewed:    The following studies were reviewed today:  No prior cardiovascular studies available.   EKG:  EKG is personally reviewed.   11/27/2021: sinus rhythm with sinus arrhythmia at 72 bpm  Recent Labs: 10/22/2021: ALT 13; BUN 14; Creatinine, Ser 0.78; Hemoglobin 13.6; Platelets 308; Potassium 4.5; Sodium 144; TSH 1.840   Recent Lipid Panel    Component Value Date/Time   CHOL 270 (H) 10/22/2021 0831   TRIG 136 10/22/2021 0831   HDL 63 10/22/2021 0831   CHOLHDL 4.3 10/22/2021 0831   CHOLHDL 2.5 05/28/2016 1630   VLDL 27 05/28/2016 1630   LDLCALC 183 (H) 10/22/2021 0831    Physical Exam:    VS:  BP 100/68 (BP Location: Left Arm, Patient Position: Sitting, Cuff Size: Normal)   Pulse 72   Ht '5\' 2"'$  (1.575 m)   Wt 131 lb (59.4 kg)   LMP 04/05/2012   BMI 23.96 kg/m     Wt Readings from Last 3 Encounters:  11/27/21 131 lb (59.4 kg)  10/22/21 129 lb 6.4 oz (58.7 kg)  10/19/20 129 lb (58.5 kg)    GEN: Well nourished, well developed in no acute distress HEENT: Normal, moist mucous membranes NECK: No JVD CARDIAC: regular rhythm, normal S1 and S2, no rubs or gallops. No murmur. VASCULAR: Radial and DP pulses 2+ bilaterally. No carotid bruits RESPIRATORY:  Clear to auscultation without rales, wheezing or rhonchi  ABDOMEN: Soft, non-tender, non-distended MUSCULOSKELETAL:  Ambulates independently SKIN: Warm and dry, no edema NEUROLOGIC:  Alert and oriented x 3. No focal neuro deficits noted. PSYCHIATRIC:  Normal affect    ASSESSMENT:     1. Family history of heart disease   2. Pure hypercholesterolemia   3. Cardiac risk counseling   4. Counseling on health promotion and disease prevention    PLAN:    Hypercholesterolemia Family history of heart disease -we reviewed the pros/cons of calcium scores. A cardiac CT scan for coronary calcium is a non-invasive way of obtaining information about the presence, location and extent of calcified plaque in the coronary arteries--the vessels that supply oxygen-containing blood to the heart muscle. Calcified plaque results when there is a build-up of fat and other substances under the inner layer of the artery. This material can calcify which signals the presence of atherosclerosis, a disease of the vessel wall, also called coronary artery disease.  People with this disease have an increased risk for heart attacks. In addition, over time, progression of plaque build up (CAD) can narrow the arteries or even close off blood flow to the heart. Because calcium is a marker of CAD, the amount of calcium detected on a cardiac CT scan is a helpful prognostic tool.  -we reviewed the charts together which show  the relationship between calcium score and 15 year all cause mortality -after shared decision making, will proceed with coronary calcium score. They understand this is an out of pocket/self pay test currently costing $99. -if calcium score nonzero, we did preliminarily discuss statin and the data for benefit   Cardiac risk counseling and prevention recommendations: -recommend heart healthy/Mediterranean diet, with whole grains, fruits, vegetable, fish, lean meats, nuts, and olive oil. Limit salt. -recommend moderate walking, 3-5 times/week for 30-50 minutes each session. Aim for at least 150 minutes.week. Goal should be pace of 3 miles/hours, or walking 1.5 miles in 30 minutes -recommend avoidance of tobacco products. Avoid excess alcohol. -ASCVD risk score: The 10-year ASCVD risk score (Arnett  DK, et al., 2019) is: 1.5%   Values used to calculate the score:     Age: 51 years     Sex: Female     Is Non-Hispanic African American: No     Diabetic: No     Tobacco smoker: No     Systolic Blood Pressure: 606 mmHg     Is BP treated: No     HDL Cholesterol: 63 mg/dL     Total Cholesterol: 270 mg/dL    Plan for follow up: 2 years or sooner as needed.  Buford Dresser, MD, PhD, [facility]    Medication Adjustments/Labs and Tests Ordered: Current medicines are reviewed at length with the patient today.  Concerns regarding medicines are outlined above.   Orders Placed This Encounter  Procedures   CT CARDIAC SCORING (SELF PAY ONLY)   EKG 12-Lead   No orders of the defined types were placed in this encounter.  Patient Instructions  Medication Instructions:  Your Physician recommend you continue on your current medication as directed.    *If you need a refill on your cardiac medications before your next appointment, please call your pharmacy*   Lab Work: None ordered today   Testing/Procedures: CT coronary calcium score.   Test locations:  [facility]   This is $99 out of pocket.   Coronary CalciumScan A coronary calcium scan is an imaging test used to look for deposits of calcium and other fatty materials (plaques) in the inner lining of the blood vessels of the heart (coronary arteries). These deposits of calcium and plaques can partly clog and narrow the coronary arteries without producing any symptoms or warning signs. This puts a person at risk for a heart attack. This test can detect these deposits before symptoms develop. Tell a health care provider about: Any allergies you have. All medicines you are taking, including vitamins, herbs, eye drops, creams, and over-the-counter medicines. Any problems you or family members have had with anesthetic medicines. Any blood disorders you have. Any surgeries you have had. Any medical  conditions you have. Whether you are pregnant or may be pregnant. What are the risks? Generally, this is a safe procedure. However, problems may occur, including: Harm to a pregnant woman and her unborn baby. This test involves the use of radiation. Radiation exposure can be dangerous to a pregnant woman and her unborn baby. If you are pregnant, you generally should not have this procedure done. Slight increase in the risk of cancer. This is because of the radiation involved in the test. What happens before the procedure? No preparation is needed for this procedure. What happens during the procedure? You will undress and remove any jewelry around your neck or chest. You will put on a hospital gown. Sticky electrodes  will be placed on your chest. The electrodes will be connected to an electrocardiogram (ECG) machine to record a tracing of the electrical activity of your heart. A CT scanner will take pictures of your heart. During this time, you will be asked to lie still and hold your breath for 2-3 seconds while a picture of your heart is being taken. The procedure may vary among health care providers and hospitals. What happens after the procedure? You can get dressed. You can return to your normal activities. It is up to you to get the results of your test. Ask your health care provider, or the department that is doing the test, when your results will be ready. Summary A coronary calcium scan is an imaging test used to look for deposits of calcium and other fatty materials (plaques) in the inner lining of the blood vessels of the heart (coronary arteries). Generally, this is a safe procedure. Tell your health care provider if you are pregnant or may be pregnant. No preparation is needed for this procedure. A CT scanner will take pictures of your heart. You can return to your normal activities after the scan is done. This information is not intended to replace advice given to you by your  health care provider. Make sure you discuss any questions you have with your health care provider. Document Released: 10/19/2007 Document Revised: 03/11/2016 Document Reviewed: 03/11/2016 Elsevier Interactive Patient Education  2017 [place]: At [facility], you and your health needs are our priority.  As part of our continuing mission to provide you with exceptional heart care, we have created designated Provider Care Teams.  These Care Teams include your primary Cardiologist (physician) and Advanced Practice Providers (APPs -  Physician Assistants and Nurse Practitioners) who all work together to provide you with the care you need, when you need it.  We recommend signing up for the patient portal called "MyChart".  Sign up information is provided on this After Visit Summary.  MyChart is used to connect with patients for Virtual Visits (Telemedicine).  Patients are able to view lab/test results, encounter notes, upcoming appointments, etc.  Non-urgent messages can be sent to your provider as well.   To learn more about what you can do with MyChart, go to [email or web address].    Your next appointment:   2 year(s)  The format for your next appointment:   In Person  Provider:   Buford Dresser, MD{      '   I,Mathew Stumpf,acting as a Education administrator for Buford Dresser, MD.,have documented all relevant documentation on the behalf of Buford Dresser, MD,as directed by  Buford Dresser, MD while in the presence of Buford Dresser, MD.  I, Buford Dresser, MD, have reviewed all documentation for this visit. The documentation on 01/08/22 for the exam, diagnosis, procedures, and orders are all accurate and complete.   Signed, Buford Dresser, MD PhD 11/27/2021     [place]

## 2021-12-13 ENCOUNTER — Ambulatory Visit: Payer: Self-pay | Admitting: Surgery

## 2021-12-25 ENCOUNTER — Ambulatory Visit
Admission: RE | Admit: 2021-12-25 | Discharge: 2021-12-25 | Disposition: A | Discharge status: Home / Self Care | Payer: BC Managed Care – PPO | Source: Ambulatory Visit | Attending: Cardiology | Admitting: Cardiology

## 2021-12-25 DIAGNOSIS — Z8249 Family history of ischemic heart disease and other diseases of the circulatory system: Secondary | ICD-10-CM | POA: Insufficient documentation

## 2021-12-25 DIAGNOSIS — E7800 Pure hypercholesterolemia, unspecified: Secondary | ICD-10-CM | POA: Insufficient documentation

## 2022-01-02 ENCOUNTER — Encounter (HOSPITAL_BASED_OUTPATIENT_CLINIC_OR_DEPARTMENT_OTHER): Payer: Self-pay

## 2022-01-02 DIAGNOSIS — Z5181 Encounter for therapeutic drug level monitoring: Secondary | ICD-10-CM

## 2022-01-02 DIAGNOSIS — E7800 Pure hypercholesterolemia, unspecified: Secondary | ICD-10-CM

## 2022-01-03 MED ORDER — ROSUVASTATIN CALCIUM 20 MG PO TABS: 20.0000 mg | ORAL_TABLET | Freq: Every day | ORAL | 3 refills | Status: DC

## 2022-01-03 MED ORDER — ASPIRIN 81 MG PO TBEC: 81.0000 mg | DELAYED_RELEASE_TABLET | Freq: Every day | ORAL | 3 refills | Status: AC

## 2022-01-08 ENCOUNTER — Encounter (HOSPITAL_BASED_OUTPATIENT_CLINIC_OR_DEPARTMENT_OTHER): Payer: Self-pay | Admitting: Cardiology

## 2022-03-13 LAB — HEPATIC FUNCTION PANEL
ALT: 19 IU/L (ref 0–32)
AST: 22 IU/L (ref 0–40)
Albumin: 4.7 g/dL (ref 3.8–4.9)
Alkaline Phosphatase: 87 IU/L (ref 44–121)
Bilirubin Total: 0.5 mg/dL (ref 0.0–1.2)
Bilirubin, Direct: 0.15 mg/dL (ref 0.00–0.40)
Total Protein: 7 g/dL (ref 6.0–8.5)

## 2022-03-13 LAB — LIPID PANEL
Chol/HDL Ratio: 2.1 ratio (ref 0.0–4.4)
Cholesterol, Total: 132 mg/dL (ref 100–199)
HDL: 64 mg/dL (ref >39)
LDL Chol Calc (NIH): 54 mg/dL (ref 0–99)
Triglycerides: 66 mg/dL (ref 0–149)
VLDL Cholesterol Cal: 14 mg/dL (ref 5–40)

## 2022-04-11 ENCOUNTER — Encounter (HOSPITAL_BASED_OUTPATIENT_CLINIC_OR_DEPARTMENT_OTHER): Payer: Self-pay | Admitting: Surgery

## 2022-04-11 DIAGNOSIS — M7989 Other specified soft tissue disorders: Secondary | ICD-10-CM | POA: Diagnosis present

## 2022-04-11 NOTE — H&P (Signed)
REFERRING PHYSICIAN: Megan Salon., MD  PROVIDER: Robbin Escher Charlotta Newton, MD   Chief Complaint: New Consultation (Soft tissue masses left clavicle)  History of Present Illness:  Patient is referred by Dr. Lyman Speller for surgical evaluation and management of soft tissue masses involving the left shoulder along the left clavicle. Patient states that these have been present for approximately 2 years and have gradually increased in size. She does not have any other such lesions on her torso or extremities. She denies any history of trauma. Was evaluated by her physician in [place], [place]. She underwent an ultrasound examination on December 13, 2020. This indicated 2 masses adjacent to the left clavicle. The larger mass inferior to the left clavicle measured 4.8 x 2.5 x 1.3 cm. No worrisome findings were identified. Patient notes that the mass has increased in size and become more visible. It does cause discomfort with exercise. It affects her clothing and is at the point where her sports bra comes across to her shoulder. She presents today to discuss surgical excision.  Review of Systems: A complete review of systems was obtained from the patient. I have reviewed this information and discussed as appropriate with the patient. See HPI as well for other ROS.  Review of Systems Constitutional: Negative. HENT: Negative. Eyes: Negative. Respiratory: Negative. Cardiovascular: Negative. Gastrointestinal: Negative. Genitourinary: Negative. Musculoskeletal: Positive for neck pain. Skin: Negative. Neurological: Negative. Endo/Heme/Allergies: Negative. Psychiatric/Behavioral: Negative.  Medical History: Past Medical History: Diagnosis Date Anemia  Patient Active Problem List Diagnosis Mass of soft tissue of shoulder  Past Surgical History: Procedure Laterality Date LAPAROSCOPIC ASSISTED VAGINAL HYSTERECTOMY, OPEN BILATERAL SALPINGECTOMY 04/21/2012 Dr. Loletha Grayer.  [place]   No Known Allergies  Current Outpatient Medications on File Prior to Visit Medication Sig Dispense Refill estradioL (ESTRACE) 0.5 MG tablet Take by mouth  No current facility-administered medications on file prior to visit.  Family History Problem Relation Age of Onset Hyperlipidemia (Elevated cholesterol) Mother Obesity Brother Hyperlipidemia (Elevated cholesterol) Brother   Social History  Tobacco Use Smoking Status Never Smokeless Tobacco Never   Social History  Socioeconomic History Marital status: Married Tobacco Use Smoking status: Never Smokeless tobacco: Never Substance and Sexual Activity Alcohol use: Yes Alcohol/week: 3.0 - 5.0 standard drinks Types: 3 - 5 Standard drinks or equivalent per week Drug use: Never  Objective:  Vitals: BP: 112/72 Pulse: 87 Temp: 36.8 C (98.2 F) SpO2: 97% Weight: 60 kg (132 lb 3.2 oz) Height: 157.5 cm ('5\' 2"'$ )  Body mass index is 24.18 kg/m.  Physical Exam  GENERAL APPEARANCE Comfortable, no acute issues Development: normal Gross deformities: none  SKIN Rash, lesions, ulcers: none Induration, erythema: none Nodules: none palpable  EYES Conjunctiva and lids: normal Pupils: equal and reactive  EARS, NOSE, MOUTH, THROAT External ears: no lesion or deformity External nose: no lesion or deformity Hearing: grossly normal  NECK Symmetric: yes Trachea: midline Thyroid: no palpable nodules in the thyroid bed  CHEST Respiratory effort: normal Retraction or accessory muscle use: no Breath sounds: normal bilaterally Rales, rhonchi, wheeze: none  CARDIOVASCULAR Auscultation: regular rhythm, normal rate Murmurs: none Pulses: radial pulse 2+ palpable Lower extremity edema: none  ABDOMEN Not assessed  GENITOURINARY/RECTAL Not assessed  MUSCULOSKELETAL Station and gait: normal Digits and nails: no clubbing or cyanosis Muscle strength: grossly normal all extremities Range of motion: grossly  normal all extremities Deformity: There is a soft tissue mass just inferior to the left clavicle on the upper chest wall. This is soft, discrete, mobile, and  nontender. It has defined margins. It measures 9 x 4 x 3 cm in size. Just cephalad and medial to this mass above the clavicle was a second soft tissue mass measuring approximately 4 x 3 x 2 cm in size, slightly less discrete but mobile and nontender.  LYMPHATIC Cervical: none palpable Supraclavicular: none palpable  PSYCHIATRIC Oriented to person, place, and time: yes Mood and affect: normal for situation Judgment and insight: appropriate for situation   Assessment and Plan:  Mass of soft tissue of shoulder  Patient presents today on referral from Dr. Sabra Heck for surgical evaluation and recommendations regarding a soft tissue mass involving the anterior left shoulder and left supraclavicular fossa. We reviewed the ultrasound that she had performed 1 year ago. On examination, this appears to be at least 1 or possibly 2 benign lipomas. Patient is concerned about the increase in size and the fact that it does cause some discomfort with her clothing and with physical exercise. She would like to undergo surgical resection.  Today we discussed potential locations for surgical incision. We discussed the possibility of postoperative seroma formation. We discussed restrictions on her activities after surgery. The patient understands and wishes to proceed with excision in the near future.  Armandina Gemma, MD [facility] [place] [facility] Office: [phone]

## 2022-04-17 ENCOUNTER — Encounter (HOSPITAL_BASED_OUTPATIENT_CLINIC_OR_DEPARTMENT_OTHER): Payer: Self-pay | Admitting: Surgery

## 2022-04-17 NOTE — Progress Notes (Signed)
Spoke w/ via phone for pre-op interview--- Debra Caldwell Lab needs dos---- NONE              Lab results------ Current EKG in Epic dated 11/2021. COVID test -----patient states asymptomatic no test needed Arrive at -------1200 NPO after MN NO Solid Food.  Clear liquids from MN until---1100 Med rec completed Medications to take morning of surgery -----NONE Diabetic medication ----- Patient instructed no nail polish to be worn day of surgery Patient instructed to bring photo id and insurance card day of surgery Patient aware to have Driver (ride ) / caregiver  Daughter Debra Caldwell  for 24 hours after surgery  Patient Special Instructions ----- Pre-Op special Istructions ----- Patient verbalized understanding of instructions that were given at this phone interview. Patient denies shortness of breath, chest pain, fever, cough at this phone interview.

## 2022-04-22 ENCOUNTER — Encounter (HOSPITAL_BASED_OUTPATIENT_CLINIC_OR_DEPARTMENT_OTHER): Payer: Self-pay | Admitting: Surgery

## 2022-04-22 ENCOUNTER — Ambulatory Visit: Payer: Self-pay | Admitting: Surgery

## 2022-04-22 ENCOUNTER — Ambulatory Visit (HOSPITAL_BASED_OUTPATIENT_CLINIC_OR_DEPARTMENT_OTHER): Payer: BC Managed Care – PPO | Admitting: Anesthesiology

## 2022-04-22 ENCOUNTER — Encounter (HOSPITAL_BASED_OUTPATIENT_CLINIC_OR_DEPARTMENT_OTHER)
Admission: RE | Disposition: A | Discharge status: Home / Self Care | Payer: Self-pay | Source: Home / Self Care | Attending: Surgery

## 2022-04-22 ENCOUNTER — Ambulatory Visit (HOSPITAL_BASED_OUTPATIENT_CLINIC_OR_DEPARTMENT_OTHER)
Admission: RE | Admit: 2022-04-22 | Discharge: 2022-04-22 | Disposition: A | Discharge status: Home / Self Care | Payer: BC Managed Care – PPO | Attending: Surgery | Admitting: Surgery

## 2022-04-22 ENCOUNTER — Other Ambulatory Visit: Payer: Self-pay

## 2022-04-22 DIAGNOSIS — M7989 Other specified soft tissue disorders: Secondary | ICD-10-CM | POA: Diagnosis present

## 2022-04-22 DIAGNOSIS — D1722 Benign lipomatous neoplasm of skin and subcutaneous tissue of left arm: Secondary | ICD-10-CM | POA: Diagnosis present

## 2022-04-22 HISTORY — PX: MASS EXCISION: SHX2000

## 2022-04-22 SURGERY — EXCISION MASS
Anesthesia: General | Site: Shoulder | Laterality: Left

## 2022-04-22 MED ORDER — PHENYLEPHRINE 80 MCG/ML (10ML) SYRINGE FOR IV PUSH (FOR BLOOD PRESSURE SUPPORT)
PREFILLED_SYRINGE | INTRAVENOUS | Status: DC | PRN
  Administered 2022-04-22: 80 ug via INTRAVENOUS

## 2022-04-22 MED ORDER — LACTATED RINGERS IV SOLN: INTRAVENOUS | Status: DC

## 2022-04-22 MED ORDER — PROPOFOL 10 MG/ML IV BOLUS
INTRAVENOUS | Status: DC | PRN
  Administered 2022-04-22: 160 mg via INTRAVENOUS

## 2022-04-22 MED ORDER — PHENYLEPHRINE 80 MCG/ML (10ML) SYRINGE FOR IV PUSH (FOR BLOOD PRESSURE SUPPORT)
PREFILLED_SYRINGE | INTRAVENOUS | Status: AC
  Filled 2022-04-22: qty 10

## 2022-04-22 MED ORDER — DEXAMETHASONE SODIUM PHOSPHATE 10 MG/ML IJ SOLN
INTRAMUSCULAR | Status: DC | PRN
  Administered 2022-04-22: 5 mg via INTRAVENOUS

## 2022-04-22 MED ORDER — LIDOCAINE HCL (PF) 2 % IJ SOLN
INTRAMUSCULAR | Status: AC
  Filled 2022-04-22: qty 5

## 2022-04-22 MED ORDER — LIDOCAINE 2% (20 MG/ML) 5 ML SYRINGE
INTRAMUSCULAR | Status: DC | PRN
  Administered 2022-04-22: 40 mg via INTRAVENOUS

## 2022-04-22 MED ORDER — FENTANYL CITRATE (PF) 100 MCG/2ML IJ SOLN: 25.0000 ug | INTRAMUSCULAR | Status: DC | PRN

## 2022-04-22 MED ORDER — PROPOFOL 10 MG/ML IV BOLUS
INTRAVENOUS | Status: AC
  Filled 2022-04-22: qty 20

## 2022-04-22 MED ORDER — DEXAMETHASONE SODIUM PHOSPHATE 10 MG/ML IJ SOLN
INTRAMUSCULAR | Status: AC
  Filled 2022-04-22: qty 1

## 2022-04-22 MED ORDER — ONDANSETRON HCL 4 MG/2ML IJ SOLN
INTRAMUSCULAR | Status: AC
  Filled 2022-04-22: qty 2

## 2022-04-22 MED ORDER — MIDAZOLAM HCL 2 MG/2ML IJ SOLN
INTRAMUSCULAR | Status: AC
  Filled 2022-04-22: qty 2

## 2022-04-22 MED ORDER — CHLORHEXIDINE GLUCONATE CLOTH 2 % EX PADS: 6.0000 | MEDICATED_PAD | Freq: Once | CUTANEOUS | Status: DC

## 2022-04-22 MED ORDER — TRAMADOL HCL 50 MG PO TABS: 50.0000 mg | ORAL_TABLET | Freq: Four times a day (QID) | ORAL | 0 refills | Status: DC | PRN

## 2022-04-22 MED ORDER — MIDAZOLAM HCL 2 MG/2ML IJ SOLN
INTRAMUSCULAR | Status: DC | PRN
  Administered 2022-04-22: 2 mg via INTRAVENOUS

## 2022-04-22 MED ORDER — BUPIVACAINE HCL 0.5 % IJ SOLN
INTRAMUSCULAR | Status: DC | PRN
  Administered 2022-04-22: 10 mL

## 2022-04-22 MED ORDER — ONDANSETRON HCL 4 MG/2ML IJ SOLN
INTRAMUSCULAR | Status: DC | PRN
  Administered 2022-04-22: 4 mg via INTRAVENOUS

## 2022-04-22 MED ORDER — EPHEDRINE SULFATE-NACL 50-0.9 MG/10ML-% IV SOSY
PREFILLED_SYRINGE | INTRAVENOUS | Status: DC | PRN
  Administered 2022-04-22: 10 mg via INTRAVENOUS

## 2022-04-22 MED ORDER — CEFAZOLIN SODIUM-DEXTROSE 2-4 GM/100ML-% IV SOLN
INTRAVENOUS | Status: AC
  Filled 2022-04-22: qty 100

## 2022-04-22 MED ORDER — FENTANYL CITRATE (PF) 100 MCG/2ML IJ SOLN
INTRAMUSCULAR | Status: DC | PRN
  Administered 2022-04-22: 50 ug via INTRAVENOUS

## 2022-04-22 MED ORDER — FENTANYL CITRATE (PF) 100 MCG/2ML IJ SOLN
INTRAMUSCULAR | Status: AC
  Filled 2022-04-22: qty 2

## 2022-04-22 MED ORDER — CEFAZOLIN SODIUM-DEXTROSE 2-4 GM/100ML-% IV SOLN
2.0000 g | INTRAVENOUS | Status: AC
  Administered 2022-04-22: 2 g via INTRAVENOUS

## 2022-04-22 SURGICAL SUPPLY — 26 items
ADH SKN CLS APL DERMABOND .7 (GAUZE/BANDAGES/DRESSINGS) ×1
APL PRP STRL LF DISP 70% ISPRP (MISCELLANEOUS) ×1
BLADE SURG 15 STRL LF DISP TIS (BLADE) ×1 IMPLANT
BLADE SURG 15 STRL SS (BLADE) ×1
CHLORAPREP W/TINT 26 (MISCELLANEOUS) ×1 IMPLANT
COVER BACK TABLE 60X90IN (DRAPES) ×1 IMPLANT
COVER MAYO STAND STRL (DRAPES) ×1 IMPLANT
DERMABOND ADVANCED .7 DNX12 (GAUZE/BANDAGES/DRESSINGS) IMPLANT
DRAPE LAPAROTOMY 100X72 PEDS (DRAPES) IMPLANT
DRAPE SHEET LG 3/4 BI-LAMINATE (DRAPES) IMPLANT
DRAPE UTILITY XL STRL (DRAPES) ×1 IMPLANT
ELECT REM PT RETURN 9FT ADLT (ELECTROSURGICAL) ×1
ELECTRODE REM PT RTRN 9FT ADLT (ELECTROSURGICAL) ×1 IMPLANT
GLOVE SURG ORTHO 8.0 STRL STRW (GLOVE) ×1 IMPLANT
GOWN STRL REUS W/TWL LRG LVL3 (GOWN DISPOSABLE) ×1 IMPLANT
GOWN STRL REUS W/TWL XL LVL3 (GOWN DISPOSABLE) IMPLANT
KIT TURNOVER CYSTO (KITS) ×1 IMPLANT
NDL HYPO 25X1 1.5 SAFETY (NEEDLE) ×1 IMPLANT
NEEDLE HYPO 25X1 1.5 SAFETY (NEEDLE) ×1 IMPLANT
NS IRRIG 500ML POUR BTL (IV SOLUTION) ×1 IMPLANT
PACK BASIN DAY SURGERY FS (CUSTOM PROCEDURE TRAY) ×1 IMPLANT
PENCIL SMOKE EVACUATOR (MISCELLANEOUS) ×1 IMPLANT
SUT MNCRL AB 4-0 PS2 18 (SUTURE) ×1 IMPLANT
SUT VIC AB 3-0 SH 18 (SUTURE) IMPLANT
SYR CONTROL 10ML LL (SYRINGE) ×1 IMPLANT
TOWEL OR 17X26 10 PK STRL BLUE (TOWEL DISPOSABLE) ×1 IMPLANT

## 2022-04-22 NOTE — Anesthesia Procedure Notes (Signed)
Procedure Name: LMA Insertion Date/Time: 04/22/2022 1:47 PM  Performed by: Suan Halter, CRNAPre-anesthesia Checklist: Patient identified, Emergency Drugs available, Suction available and Patient being monitored Patient Re-evaluated:Patient Re-evaluated prior to induction Oxygen Delivery Method: Circle system utilized Preoxygenation: Pre-oxygenation with 100% oxygen Induction Type: IV induction Ventilation: Mask ventilation without difficulty LMA: LMA inserted LMA Size: 4.0 Number of attempts: 1 Airway Equipment and Method: Bite block Placement Confirmation: positive ETCO2 Tube secured with: Tape Dental Injury: Teeth and Oropharynx as per pre-operative assessment

## 2022-04-22 NOTE — Transfer of Care (Signed)
Immediate Anesthesia Transfer of Care Note  Patient: Debra Caldwell  Procedure(s) Performed: Procedure(s) (LRB): EXCISION OF SOFT TISSUE MASSES LEFT INFERIOR SHOULDER (10.5cm x 6cm x 2cm) (Left)  Patient Location: PACU  Anesthesia Type: General  Level of Consciousness: awake, oriented, sedated and patient cooperative  Airway & Oxygen Therapy: Patient Spontanous Breathing and Patient connected to face mask oxygen  Post-op Assessment: Report given to PACU RN and Post -op Vital signs reviewed and stable  Post vital signs: Reviewed and stable  Complications: No apparent anesthesia complications  Last Vitals:  Vitals Value Taken Time  BP 116/73 04/22/22 1445  Temp 36.6 C 04/22/22 1437  Pulse 74 04/22/22 1500  Resp 16 04/22/22 1500  SpO2 98 % 04/22/22 1500  Vitals shown include unvalidated device data.  Last Pain:  Vitals:   04/22/22 1437  TempSrc:   PainSc: 0-No pain      Patients Stated Pain Goal: 7 (96/28/36 6294)  Complications: No notable events documented.

## 2022-04-22 NOTE — Anesthesia Preprocedure Evaluation (Addendum)
Anesthesia Evaluation  Patient identified by MRN, date of birth, ID band Patient awake  General Assessment Comment:History noted Dr. Nyoka Cowden  Reviewed: Allergy & Precautions, NPO status , Patient's Chart, lab work & pertinent test results  Airway Mallampati: II       Dental   Pulmonary neg pulmonary ROS   breath sounds clear to auscultation       Cardiovascular  Rhythm:Regular Rate:Normal  History noted Dr. Nyoka Cowden   Neuro/Psych  Headaches PSYCHIATRIC DISORDERS         GI/Hepatic negative GI ROS, Neg liver ROS,,,  Endo/Other  negative endocrine ROS    Renal/GU negative Renal ROS     Musculoskeletal   Abdominal   Peds  Hematology   Anesthesia Other Findings   Reproductive/Obstetrics                             Anesthesia Physical Anesthesia Plan  ASA: 2  Anesthesia Plan: General   Post-op Pain Management: Tylenol PO (pre-op)*   Induction:   PONV Risk Score and Plan: 2 and Ondansetron, Dexamethasone and Midazolam  Airway Management Planned: LMA  Additional Equipment:   Intra-op Plan:   Post-operative Plan: Extubation in OR  Informed Consent: I have reviewed the patients History and Physical, chart, labs and discussed the procedure including the risks, benefits and alternatives for the proposed anesthesia with the patient or authorized representative who has indicated his/her understanding and acceptance.     Dental advisory given  Plan Discussed with: CRNA and Anesthesiologist  Anesthesia Plan Comments:        Anesthesia Quick Evaluation

## 2022-04-22 NOTE — H&P (Signed)
REFERRING PHYSICIAN: Megan Salon., MD  PROVIDER: Jammal Sarr Charlotta Newton, MD   Chief Complaint: New Consultation (Soft tissue masses left clavicle)  History of Present Illness:  Patient is referred by Dr. Lyman Speller for surgical evaluation and management of soft tissue masses involving the left shoulder along the left clavicle. Patient states that these have been present for approximately 2 years and have gradually increased in size. She does not have any other such lesions on her torso or extremities. She denies any history of trauma. Was evaluated by her physician in [place], [place]. She underwent an ultrasound examination on December 13, 2020. This indicated 2 masses adjacent to the left clavicle. The larger mass inferior to the left clavicle measured 4.8 x 2.5 x 1.3 cm. No worrisome findings were identified. Patient notes that the mass has increased in size and become more visible. It does cause discomfort with exercise. It affects her clothing and is at the point where her sports bra comes across to her shoulder. She presents today to discuss surgical excision.  Review of Systems: A complete review of systems was obtained from the patient. I have reviewed this information and discussed as appropriate with the patient. See HPI as well for other ROS.  Review of Systems Constitutional: Negative. HENT: Negative. Eyes: Negative. Respiratory: Negative. Cardiovascular: Negative. Gastrointestinal: Negative. Genitourinary: Negative. Musculoskeletal: Positive for neck pain. Skin: Negative. Neurological: Negative. Endo/Heme/Allergies: Negative. Psychiatric/Behavioral: Negative.  Medical History: Past Medical History: Diagnosis Date Anemia  Patient Active Problem List Diagnosis Mass of soft tissue of shoulder  Past Surgical History: Procedure Laterality Date LAPAROSCOPIC ASSISTED VAGINAL HYSTERECTOMY, OPEN BILATERAL SALPINGECTOMY 04/21/2012 Dr. Loletha Grayer.  [place]   No Known Allergies  Current Outpatient Medications on File Prior to Visit Medication Sig Dispense Refill estradioL (ESTRACE) 0.5 MG tablet Take by mouth  No current facility-administered medications on file prior to visit.  Family History Problem Relation Age of Onset Hyperlipidemia (Elevated cholesterol) Mother Obesity Brother Hyperlipidemia (Elevated cholesterol) Brother   Social History  Tobacco Use Smoking Status Never Smokeless Tobacco Never   Social History  Socioeconomic History Marital status: Married Tobacco Use Smoking status: Never Smokeless tobacco: Never Substance and Sexual Activity Alcohol use: Yes Alcohol/week: 3.0 - 5.0 standard drinks Types: 3 - 5 Standard drinks or equivalent per week Drug use: Never  Objective:  Vitals: BP: 112/72 Pulse: 87 Temp: 36.8 C (98.2 F) SpO2: 97% Weight: 60 kg (132 lb 3.2 oz) Height: 157.5 cm ('5\' 2"'$ )  Body mass index is 24.18 kg/m.  Physical Exam  GENERAL APPEARANCE Comfortable, no acute issues Development: normal Gross deformities: none  SKIN Rash, lesions, ulcers: none Induration, erythema: none Nodules: none palpable  EYES Conjunctiva and lids: normal Pupils: equal and reactive  EARS, NOSE, MOUTH, THROAT External ears: no lesion or deformity External nose: no lesion or deformity Hearing: grossly normal  NECK Symmetric: yes Trachea: midline Thyroid: no palpable nodules in the thyroid bed  CHEST Respiratory effort: normal Retraction or accessory muscle use: no Breath sounds: normal bilaterally Rales, rhonchi, wheeze: none  CARDIOVASCULAR Auscultation: regular rhythm, normal rate Murmurs: none Pulses: radial pulse 2+ palpable Lower extremity edema: none  ABDOMEN Not assessed  GENITOURINARY/RECTAL Not assessed  MUSCULOSKELETAL Station and gait: normal Digits and nails: no clubbing or cyanosis Muscle strength: grossly normal all extremities Range of motion: grossly  normal all extremities Deformity: There is a soft tissue mass just inferior to the left clavicle on the upper chest wall. This is soft, discrete, mobile, and  nontender. It has defined margins. It measures 9 x 4 x 3 cm in size. Just cephalad and medial to this mass above the clavicle was a second soft tissue mass measuring approximately 4 x 3 x 2 cm in size, slightly less discrete but mobile and nontender.  LYMPHATIC Cervical: none palpable Supraclavicular: none palpable  PSYCHIATRIC Oriented to person, place, and time: yes Mood and affect: normal for situation Judgment and insight: appropriate for situation   Assessment and Plan:  Mass of soft tissue of shoulder  Patient presents today on referral from Dr. Sabra Heck for surgical evaluation and recommendations regarding a soft tissue mass involving the anterior left shoulder and left supraclavicular fossa. We reviewed the ultrasound that she had performed 1 year ago. On examination, this appears to be at least 1 or possibly 2 benign lipomas. Patient is concerned about the increase in size and the fact that it does cause some discomfort with her clothing and with physical exercise. She would like to undergo surgical resection.  Today we discussed potential locations for surgical incision. We discussed the possibility of postoperative seroma formation. We discussed restrictions on her activities after surgery. The patient understands and wishes to proceed with excision in the near future.  Armandina Gemma, MD [facility] [place] practice Office: [phone]

## 2022-04-22 NOTE — Interval H&P Note (Signed)
History and Physical Interval Note:  04/22/2022 1:25 PM  Debra Caldwell  has presented today for surgery, with the diagnosis of MASS OF SOFT TISSUE OF LEFT INFERIOR SHOULDER.  The various methods of treatment have been discussed with the patient and family. After consideration of risks, benefits and other options for treatment, the patient has consented to    Procedure(s): EXCISION OF SOFT TISSUE MASSES LEFT INFERIOR SHOULDER (Left) as a surgical intervention.    The patient's history has been reviewed, patient examined, no change in status, stable for surgery.  I have reviewed the patient's chart and labs.  Questions were answered to the patient's satisfaction.    Armandina Gemma, [place] [facility] [place] practice Office: [place]

## 2022-04-22 NOTE — Op Note (Signed)
Operative Note  Pre-operative Diagnosis:  soft tissue mass left anterior shoulder  Post-operative Diagnosis:  same  Surgeon:  Armandina Gemma, MD  Assistant:  none   Procedure:  excision of soft tissue mass from left anterior shoulder, 10.5 x 6 x 2 cm, subcutaneous  Anesthesia:  general (LMA)  Estimated Blood Loss:  minimal (< 10 cc)  Drains: none         Specimen: to pathology  Indications:  Patient is referred by Dr. Lyman Speller for surgical evaluation and management of soft tissue masses involving the left shoulder along the left clavicle. Patient states that these have been present for approximately 2 years and have gradually increased in size. She does not have any other such lesions on her torso or extremities. She denies any history of trauma. Was evaluated by her physician in [place], [place]. She underwent an ultrasound examination on December 13, 2020. This indicated 2 masses adjacent to the left clavicle. The larger mass inferior to the left clavicle measured 4.8 x 2.5 x 1.3 cm. No worrisome findings were identified. Patient notes that the mass has increased in size and become more visible. It does cause discomfort with exercise. It affects her clothing and is at the point where her sports bra comes across to her shoulder. She presents today to discuss surgical excision.   Procedure:  The patient was seen in the pre-op holding area. The risks, benefits, complications, treatment options, and expected outcomes were previously discussed with the patient. The patient agreed with the proposed plan and has signed the informed consent form.  The patient was brought to the operating room by the surgical team, identified as Daryel November and the procedure verified. A "time out" was completed and the above information confirmed.  Following administration of general anesthesia, the patient was positioned and then prepped and draped in the usual aseptic fashion.  The soft  tissue mass was palpable on the anterior left shoulder.  It extended inferior to the clavicle but also extended superior and medially from the clavicle.  Incision was made longitudinally along the inferior edge of the clavicle.  Using the electrocautery hemostasis was achieved.  Dissection was carried into the subcutaneous tissues and the soft tissue mass was identified.  This appears to be a lobulated lipoma.  The part inferior to the clavicle was gently mobilized and hemostasis achieved with the electrocautery.  With gentle tension there was obviously a portion of the mass that extended cephalad and medially over the underlying clavicle and into the supraclavicular fossa.  This was also gently mobilized with blunt dissection and judicious use of the electrocautery for hemostasis.  The entire mass was delivered into the wound and excised.  In total it measured 10.5 x 6.0 x 2.0 cm in size.  It was submitted in its entirety to pathology for review.  Good hemostasis is noted throughout the wound.  Palpation reveals no residual soft tissue mass.  Skin edges are anesthetized with local anesthetic.  Subcutaneous tissues are closed with interrupted 3-0 Vicryl sutures.  Skin edges are reapproximated with a running 4-0 Monocryl subcuticular suture.  Wound was washed and dried and Dermabond is applied as dressing.  Patient is awakened from anesthesia and transported to the recovery room.  The patient tolerated the procedure well.   Armandina Gemma, [place] [facility] Office: [phone]

## 2022-04-22 NOTE — Discharge Instructions (Addendum)
  [facility] -- DISCHARGE INSTRUCTIONS  REMINDER:   Carry a list of your medications and allergies with you at all times  Call your pharmacy at least 1 week in advance to refill prescriptions  Do not mix any prescribed pain medicine with alcohol  Do not drive any motor vehicles while taking pain medication  Take medications with food unless otherwise directed  Follow-up appointments (date to return to physician): Please call [phone] to confirm your follow up appointment with your surgeon.  Call your Surgeon if you have:  Temperature greater than 101.0  Persistent nausea and vomiting  Severe uncontrolled pain  Redness, tenderness, or signs of infection (pain, swelling, redness, odor or green/yellow discharge around the site)  Difficulty breathing, headache or visual disturbances  Hives  Persistent dizziness or light-headedness  Any other questions or concerns you may have after discharge  In an emergency, call 911 or go to an Emergency Department at a nearby hospital.  Diet: Begin with liquids, and if they are tolerated, resume your usual diet.  Avoid spicy, greasy or heavy foods.  If you have nausea or vomiting, go back to liquids.  If you cannot keep liquids down, call your doctor.  Avoid alcohol consumption while on prescription pain medications. Good nutrition promotes healing. Increase fiber and fluids.   ADDITIONAL INSTRUCTIONS: Ice pack as needed for first 48 hours.  Leave Dermabond in place for 7-10 days.  Avoid heavy lifting.  [place] [facility] Office: [place] Instructions  Activity: Get plenty of rest for the remainder of the day. A responsible individual must stay with you for 24 hours following the procedure.  For the next 24 hours, DO NOT: -Drive a car -Paediatric nurse -Drink alcoholic beverages -Take any medication unless instructed by your physician -Make any legal decisions or sign important  papers.  Meals: Start with liquid foods such as gelatin or soup. Progress to regular foods as tolerated. Avoid greasy, spicy, heavy foods. If nausea and/or vomiting occur, drink only clear liquids until the nausea and/or vomiting subsides. Call your physician if vomiting continues.  Special Instructions/Symptoms: Your throat may feel dry or sore from the anesthesia or the breathing tube placed in your throat during surgery. If this causes discomfort, gargle with warm salt water. The discomfort should disappear within 24 hours.

## 2022-04-22 NOTE — Anesthesia Postprocedure Evaluation (Signed)
Anesthesia Post Note  Patient: Debra Caldwell  Procedure(s) Performed: EXCISION OF SOFT TISSUE MASSES LEFT INFERIOR SHOULDER (10.5cm x 6cm x 2cm) (Left: Shoulder)     Patient location during evaluation: PACU Anesthesia Type: General Level of consciousness: awake Pain management: pain level controlled Vital Signs Assessment: post-procedure vital signs reviewed and stable Respiratory status: spontaneous breathing Cardiovascular status: stable Postop Assessment: no apparent nausea or vomiting Anesthetic complications: no   No notable events documented.  Last Vitals:  Vitals:   04/22/22 1507 04/22/22 1530  BP: 108/71 114/68  Pulse: 70 76  Resp: 16 16  Temp:  36.5 C  SpO2: 96% 98%    Last Pain:  Vitals:   04/22/22 1530  TempSrc: Oral  PainSc: 0-No pain                 Deundra Furber

## 2022-04-23 ENCOUNTER — Encounter (HOSPITAL_BASED_OUTPATIENT_CLINIC_OR_DEPARTMENT_OTHER): Payer: Self-pay | Admitting: Surgery

## 2022-04-23 LAB — SURGICAL PATHOLOGY

## 2022-04-24 NOTE — Progress Notes (Signed)
Pathology benign, as expected.  [place], MD [facility] [place] practice Office: [phone]

## 2022-05-04 NOTE — Progress Notes (Signed)
     Responding to:  Dr. Harlow Asa,   "Mature adipose tissue, consistent with lipoma" is documented in the pathology report dated 04/22/22. Outpatient coding guidelines do not allow [place] to report diagnoses documented as "consistent with." Please further clarify the pathology findings.   - Lipoma is a clinically significant diagnosis  - Lipoma is not a clinically significant diagnosis               Clinically undetermined             Other explanation of clinical findings (please specify)      Please exercise your independent, professional judgment when responding.  A specific answer is not anticipated or expected.     Thank Dennis Bast  [place] [phone]   The final diagnosis is LIPOMA.  Lipoma is a clinically significant diagnosis.  This is more of an issue for pathology and how these results are reported than it is for surgery.  Please ask administration to address with pathology.  Armandina Gemma, MD [facility] [place] practice Office: [phone]

## 2022-10-01 ENCOUNTER — Other Ambulatory Visit: Payer: Self-pay | Admitting: Obstetrics & Gynecology

## 2022-10-01 DIAGNOSIS — Z1231 Encounter for screening mammogram for malignant neoplasm of breast: Secondary | ICD-10-CM

## 2022-10-10 ENCOUNTER — Encounter (HOSPITAL_BASED_OUTPATIENT_CLINIC_OR_DEPARTMENT_OTHER): Payer: Self-pay | Admitting: Obstetrics & Gynecology

## 2022-10-16 ENCOUNTER — Other Ambulatory Visit (HOSPITAL_BASED_OUTPATIENT_CLINIC_OR_DEPARTMENT_OTHER): Payer: Self-pay | Admitting: Obstetrics & Gynecology

## 2022-10-16 DIAGNOSIS — M85851 Other specified disorders of bone density and structure, right thigh: Secondary | ICD-10-CM

## 2022-10-16 NOTE — Progress Notes (Signed)
de

## 2022-10-17 ENCOUNTER — Other Ambulatory Visit (HOSPITAL_BASED_OUTPATIENT_CLINIC_OR_DEPARTMENT_OTHER): Payer: Self-pay | Admitting: Obstetrics & Gynecology

## 2022-10-17 DIAGNOSIS — M85851 Other specified disorders of bone density and structure, right thigh: Secondary | ICD-10-CM

## 2022-10-31 ENCOUNTER — Ambulatory Visit (HOSPITAL_BASED_OUTPATIENT_CLINIC_OR_DEPARTMENT_OTHER): Payer: BC Managed Care – PPO | Admitting: Obstetrics & Gynecology

## 2022-11-21 ENCOUNTER — Ambulatory Visit (HOSPITAL_BASED_OUTPATIENT_CLINIC_OR_DEPARTMENT_OTHER): Payer: BC Managed Care – PPO | Admitting: Obstetrics & Gynecology

## 2022-11-21 ENCOUNTER — Ambulatory Visit
Admission: RE | Admit: 2022-11-21 | Discharge: 2022-11-21 | Disposition: A | Discharge status: Home / Self Care | Payer: BC Managed Care – PPO | Source: Ambulatory Visit

## 2022-11-21 ENCOUNTER — Encounter (HOSPITAL_BASED_OUTPATIENT_CLINIC_OR_DEPARTMENT_OTHER): Payer: Self-pay

## 2022-11-21 DIAGNOSIS — Z1231 Encounter for screening mammogram for malignant neoplasm of breast: Secondary | ICD-10-CM

## 2022-11-27 ENCOUNTER — Ambulatory Visit (INDEPENDENT_AMBULATORY_CARE_PROVIDER_SITE_OTHER): Payer: BC Managed Care – PPO | Admitting: Obstetrics & Gynecology

## 2022-11-27 VITALS — BP 103/70 | HR 61 | Ht 62.0 in | Wt 126.2 lb

## 2022-11-27 DIAGNOSIS — Z803 Family history of malignant neoplasm of breast: Secondary | ICD-10-CM

## 2022-11-27 DIAGNOSIS — G43009 Migraine without aura, not intractable, without status migrainosus: Secondary | ICD-10-CM | POA: Diagnosis not present

## 2022-11-27 DIAGNOSIS — Z01419 Encounter for gynecological examination (general) (routine) without abnormal findings: Secondary | ICD-10-CM | POA: Diagnosis not present

## 2022-11-27 DIAGNOSIS — Z79890 Hormone replacement therapy (postmenopausal): Secondary | ICD-10-CM

## 2022-11-27 DIAGNOSIS — Z90710 Acquired absence of both cervix and uterus: Secondary | ICD-10-CM

## 2022-11-27 MED ORDER — ESTRADIOL 0.5 MG PO TABS: 0.5000 mg | ORAL_TABLET | Freq: Every day | ORAL | 3 refills | Status: DC

## 2022-11-27 NOTE — Progress Notes (Unsigned)
57 y.o. [id number] Married White or Caucasian female here for annual exam.  Husband had MI this last year.  He is having some depression now in dealing with the stressors of all of this.    Denies vaginal bleeding.    Patient's last menstrual period was 04/05/2012.          Sexually active: Yes.    The current method of family planning is status post hysterectomy.    Exercising: Yes.     Smoker:  no  Health Maintenance: Pap:  not indicated History of abnormal Pap:  no MMG:  11/21/2022 Negative Colonoscopy:  12/2020, follow up 10 years BMD:   09/20/2020 Osteopenia Screening Labs: 03/2022   reports that she has never smoked. She has never used smokeless tobacco. She reports current alcohol use of about 7.0 standard drinks of alcohol per week. She reports that she does not use drugs.  Past Medical History:  Diagnosis Date   Anxiety    Depression    situational   Headache(784.0)    migraines    Past Surgical History:  Procedure Laterality Date   BILATERAL SALPINGECTOMY  04/21/2012   Procedure: BILATERAL SALPINGECTOMY;  Surgeon: Alison Murray, MD;  Location: [facility];  Service: Gynecology;  Laterality: Bilateral;   LAPAROSCOPIC ASSISTED VAGINAL HYSTERECTOMY  04/21/2012   Procedure: LAPAROSCOPIC ASSISTED VAGINAL HYSTERECTOMY;  Surgeon: Alison Murray, MD;  Location: [facility];  Service: Gynecology;  Laterality: N/A;   MASS EXCISION Left 04/22/2022   Procedure: EXCISION OF SOFT TISSUE MASSES LEFT INFERIOR SHOULDER (10.5cm x 6cm x 2cm);  Surgeon: Darnell Level, MD;  Location: [facility];  Service: General;  Laterality: Left;    Current Outpatient Medications  Medication Sig Dispense Refill   aspirin EC 81 MG tablet Take 1 tablet (81 mg total) by mouth daily. Swallow whole. 90 tablet 3   Cholecalciferol (VITAMIN D-3 PO) Take 3,000 Units by mouth daily.     estradiol (ESTRACE) 0.5 MG tablet Take 1 tablet (0.5 mg total) by mouth daily. 90 tablet 3   rosuvastatin (CRESTOR)  20 MG tablet Take 1 tablet (20 mg total) by mouth daily. 90 tablet 3   tretinoin (RETIN-A) 0.05 % cream as needed.      UNABLE TO FIND rx face cream for rosacea     No current facility-administered medications for this visit.    Family History  Problem Relation Age of Onset   Breast cancer Maternal Aunt    Breast cancer Maternal Grandmother    Breast cancer Paternal Grandmother    Breast cancer Cousin    Hemochromatosis Brother     ROS: Constitutional: negative Genitourinary:negative  Exam:   BP 103/70 (BP Location: Right Arm, Patient Position: Sitting, Cuff Size: Normal)   Pulse 61   Ht 5\' 2"  (1.575 m) Comment: Reported  Wt 126 lb 3.2 oz (57.2 kg)   LMP 04/05/2012   BMI 23.08 kg/m   Height: 5\' 2"  (157.5 cm) (Reported)  General appearance: alert, cooperative and appears stated age Head: Normocephalic, without obvious abnormality, atraumatic Neck: no adenopathy, supple, symmetrical, trachea midline and thyroid normal to inspection and palpation Lungs: clear to auscultation bilaterally Breasts: normal appearance, no masses or tenderness Heart: regular rate and rhythm Abdomen: soft, non-tender; bowel sounds normal; no masses,  no organomegaly Extremities: extremities normal, atraumatic, no cyanosis or edema Skin: Skin color, texture, turgor normal. No rashes or lesions Lymph nodes: Cervical, supraclavicular, and axillary nodes normal. No abnormal inguinal nodes palpated Neurologic: Grossly normal  Pelvic: External genitalia:  no lesions              Urethra:  normal appearing urethra with no masses, tenderness or lesions              Bartholins and Skenes: normal                 Vagina: normal appearing vagina with normal color and no discharge, no lesions              Cervix: absent              Pap taken: No. Bimanual Exam:  Uterus:  uterus absent              Adnexa: no mass, fullness, tenderness               Rectovaginal: Declined               Anus:  no  lesions  Chaperone, Ina Homes, CMA, was present for exam.  Assessment/Plan: 1. Well woman exam with routine gynecological exam - Pap smear not indicated - Mammogram 11/21/2022 - Colonoscopy 12/2020, follow up 10 years. - Bone mineral density 09/20/2020 - lab work done with PCP - vaccines reviewed/updated  2. Postmenopausal HRT (hormone replacement therapy - estradiol (ESTRACE) 0.5 MG tablet; Take 1 tablet (0.5 mg total) by mouth daily.  Dispense: 90 tablet; Refill: 3  3. Family history of breast cancer  4. Hormone replacement therapy - estradiol (ESTRACE) 0.5 MG tablet; Take 1 tablet (0.5 mg total) by mouth daily.  Dispense: 90 tablet; Refill: 3  5. Migraine without aura, not refractory  6. H/O: hysterectomy

## 2022-11-28 ENCOUNTER — Encounter (HOSPITAL_BASED_OUTPATIENT_CLINIC_OR_DEPARTMENT_OTHER): Payer: Self-pay | Admitting: Obstetrics & Gynecology

## 2022-11-28 DIAGNOSIS — Z90710 Acquired absence of both cervix and uterus: Secondary | ICD-10-CM | POA: Insufficient documentation

## 2022-12-06 ENCOUNTER — Other Ambulatory Visit (HOSPITAL_BASED_OUTPATIENT_CLINIC_OR_DEPARTMENT_OTHER): Payer: Self-pay | Admitting: Family

## 2023-05-02 ENCOUNTER — Ambulatory Visit
Admission: RE | Admit: 2023-05-02 | Discharge: 2023-05-02 | Disposition: A | Discharge status: Home / Self Care | Payer: BC Managed Care – PPO | Source: Ambulatory Visit | Attending: Obstetrics & Gynecology | Admitting: Obstetrics & Gynecology

## 2023-05-02 DIAGNOSIS — M85851 Other specified disorders of bone density and structure, right thigh: Secondary | ICD-10-CM

## 2023-05-28 ENCOUNTER — Encounter (HOSPITAL_BASED_OUTPATIENT_CLINIC_OR_DEPARTMENT_OTHER): Payer: Self-pay | Admitting: Obstetrics & Gynecology

## 2023-06-02 DIAGNOSIS — E785 Hyperlipidemia, unspecified: Secondary | ICD-10-CM | POA: Insufficient documentation

## 2023-06-02 DIAGNOSIS — M8580 Other specified disorders of bone density and structure, unspecified site: Secondary | ICD-10-CM | POA: Insufficient documentation

## 2023-06-04 DIAGNOSIS — Z8249 Family history of ischemic heart disease and other diseases of the circulatory system: Secondary | ICD-10-CM | POA: Insufficient documentation

## 2023-11-20 ENCOUNTER — Other Ambulatory Visit (HOSPITAL_BASED_OUTPATIENT_CLINIC_OR_DEPARTMENT_OTHER): Payer: Self-pay | Admitting: Obstetrics & Gynecology

## 2023-11-20 ENCOUNTER — Encounter (HOSPITAL_BASED_OUTPATIENT_CLINIC_OR_DEPARTMENT_OTHER): Payer: Self-pay | Admitting: Obstetrics & Gynecology

## 2023-11-20 DIAGNOSIS — E7800 Pure hypercholesterolemia, unspecified: Secondary | ICD-10-CM

## 2023-11-20 DIAGNOSIS — E559 Vitamin D deficiency, unspecified: Secondary | ICD-10-CM

## 2023-11-20 DIAGNOSIS — R7303 Prediabetes: Secondary | ICD-10-CM

## 2023-12-01 ENCOUNTER — Encounter (HOSPITAL_BASED_OUTPATIENT_CLINIC_OR_DEPARTMENT_OTHER): Payer: Self-pay | Admitting: Obstetrics & Gynecology

## 2023-12-01 ENCOUNTER — Other Ambulatory Visit (HOSPITAL_BASED_OUTPATIENT_CLINIC_OR_DEPARTMENT_OTHER): Payer: Self-pay | Admitting: Obstetrics & Gynecology

## 2023-12-01 ENCOUNTER — Ambulatory Visit (HOSPITAL_BASED_OUTPATIENT_CLINIC_OR_DEPARTMENT_OTHER): Payer: BC Managed Care – PPO | Admitting: Obstetrics & Gynecology

## 2023-12-01 ENCOUNTER — Other Ambulatory Visit (HOSPITAL_BASED_OUTPATIENT_CLINIC_OR_DEPARTMENT_OTHER): Payer: Self-pay

## 2023-12-01 VITALS — BP 109/82 | HR 68 | Wt 129.0 lb

## 2023-12-01 DIAGNOSIS — Z90710 Acquired absence of both cervix and uterus: Secondary | ICD-10-CM | POA: Diagnosis not present

## 2023-12-01 DIAGNOSIS — E559 Vitamin D deficiency, unspecified: Secondary | ICD-10-CM

## 2023-12-01 DIAGNOSIS — M85851 Other specified disorders of bone density and structure, right thigh: Secondary | ICD-10-CM | POA: Diagnosis not present

## 2023-12-01 DIAGNOSIS — R7303 Prediabetes: Secondary | ICD-10-CM

## 2023-12-01 DIAGNOSIS — M85852 Other specified disorders of bone density and structure, left thigh: Secondary | ICD-10-CM

## 2023-12-01 DIAGNOSIS — Z79890 Hormone replacement therapy (postmenopausal): Secondary | ICD-10-CM | POA: Diagnosis not present

## 2023-12-01 DIAGNOSIS — Z01419 Encounter for gynecological examination (general) (routine) without abnormal findings: Secondary | ICD-10-CM

## 2023-12-01 DIAGNOSIS — E7800 Pure hypercholesterolemia, unspecified: Secondary | ICD-10-CM

## 2023-12-01 DIAGNOSIS — Z1231 Encounter for screening mammogram for malignant neoplasm of breast: Secondary | ICD-10-CM

## 2023-12-01 MED ORDER — ESTRADIOL 0.5 MG PO TABS: 0.5000 mg | ORAL_TABLET | Freq: Every day | ORAL | 3 refills | Status: AC

## 2023-12-01 NOTE — Progress Notes (Signed)
 ANNUAL EXAM Patient name: Debra Caldwell MRN [id number]  Date of birth: 1965-08-02 Chief Complaint:   Gynecologic Exam  History of Present Illness:   Debra Caldwell is a 58 y.o. [phone] Caucasian female being seen today for a routine annual exam.  Doing well.  Living in [place] and working [employer].    Having some hearing changes and tried to see audiology in [facility].  Needs referral.  She will get me the name of the person for the referral.  Patient's last menstrual period was 04/05/2012.   Last pap not indicated today due to h/o hysterectomy.  Last mammogram: 11/21/2022. Results were: normal. Family h/o breast cancer: no Last colonoscopy: 12/08/2020. Results were: normal. Family h/o colorectal cancer: no DEXA:  05/02/2023.  T score -2.4.       12/01/2023   11:26 AM 10/22/2021    2:14 PM  Depression screen PHQ 2/9  Decreased Interest 0 0  Down, Depressed, Hopeless 0 0  PHQ - 2 Score 0 0     Review of Systems:   Pertinent items are noted in HPI Denies any urinary or bowel changes.  Denies pelvic pain.   Pertinent History Reviewed:  Reviewed past medical,surgical, social and family history.  Reviewed problem list, medications and allergies. Physical Assessment:   Vitals:   12/01/23 1042  BP: 109/82  Pulse: 68  SpO2: 100%  Weight: 129 lb (58.5 kg)  Body mass index is 23.59 kg/m.        Physical Examination:   General appearance - well appearing, and in no distress  Mental status - alert, oriented to person, place, and time  Psych:  She has a normal mood and affect  Skin - warm and dry, normal color, no suspicious lesions noted  Chest - effort normal, all lung fields clear to auscultation bilaterally  Heart - normal rate and regular rhythm  Neck:  midline trachea, no thyromegaly or nodules  Breasts - breasts appear normal, no suspicious masses, no skin or nipple changes or  axillary nodes  Abdomen - soft, nontender, nondistended, no masses or  organomegaly  Pelvic - VULVA: normal appearing vulva with no masses, tenderness or lesions  CERVIX: surgically absent  Thin prep pap is not obtained  UTERUS: surgically absent  ADNEXA: No adnexal masses or tenderness noted.  Rectal - normal rectal, good sphincter tone, no masses felt.   Extremities:  No swelling or varicosities noted  Chaperone present for exam  No results found for this or any previous visit (from the past 24 hours).  Assessment & Plan:  1. Well woman exam with routine gynecological exam (Primary) - Pap smear not indicated - Mammogram 11/22/2022.  Wants to scheduled closer to her home - Colonoscopy 2022 - Bone mineral density will be due in 2026 - lab work done this morning.  Establishing care with PCP in [place] area in September - vaccines reviewed/updated  2. Encounter for screening mammogram for malignant neoplasm of breast - MM 3D SCREENING MAMMOGRAM BILATERAL BREAST; Future  3. Postmenopausal HRT (hormone replacement therapy) - estradiol  (ESTRACE ) 0.5 MG tablet; Take 1 tablet (0.5 mg total) by mouth daily.  Dispense: 90 tablet; Refill: 3  4. Osteopenia of necks of both femurs - taking Vit D.  Has had difficulty tolerating calcium .  5. H/O: hysterectomy    No orders of the defined types were placed in this encounter.   Meds: No orders of the defined types were placed in this encounter.   Follow-up:  Return in about 1 year (around 11/30/2024).  Ronal GORMAN Pinal, MD 12/01/2023 11:34 AM

## 2023-12-02 ENCOUNTER — Ambulatory Visit (HOSPITAL_BASED_OUTPATIENT_CLINIC_OR_DEPARTMENT_OTHER): Payer: Self-pay | Admitting: Obstetrics & Gynecology

## 2023-12-02 LAB — COMPREHENSIVE METABOLIC PANEL WITH GFR
ALT: 18 IU/L (ref 0–32)
AST: 19 IU/L (ref 0–40)
Albumin: 4.3 g/dL (ref 3.8–4.9)
Alkaline Phosphatase: 84 IU/L (ref 44–121)
BUN/Creatinine Ratio: 17 (ref 9–23)
BUN: 12 mg/dL (ref 6–24)
Bilirubin Total: 0.4 mg/dL (ref 0.0–1.2)
CO2: 22 mmol/L (ref 20–29)
Calcium: 9.5 mg/dL (ref 8.7–10.2)
Chloride: 106 mmol/L (ref 96–106)
Creatinine, Ser: 0.7 mg/dL (ref 0.57–1.00)
Globulin, Total: 2.5 g/dL (ref 1.5–4.5)
Glucose: 99 mg/dL (ref 70–99)
Potassium: 4.6 mmol/L (ref 3.5–5.2)
Sodium: 141 mmol/L (ref 134–144)
Total Protein: 6.8 g/dL (ref 6.0–8.5)
eGFR: 100 mL/min/1.73 (ref >59)

## 2023-12-02 LAB — LIPID PANEL
Chol/HDL Ratio: 2.2 ratio (ref 0.0–4.4)
Cholesterol, Total: 141 mg/dL (ref 100–199)
HDL: 64 mg/dL (ref >39)
LDL Chol Calc (NIH): 63 mg/dL (ref 0–99)
Triglycerides: 72 mg/dL (ref 0–149)
VLDL Cholesterol Cal: 14 mg/dL (ref 5–40)

## 2023-12-02 LAB — VITAMIN D 25 HYDROXY (VIT D DEFICIENCY, FRACTURES): Vit D, 25-Hydroxy: 48 ng/mL (ref 30.0–100.0)

## 2023-12-02 LAB — HEMOGLOBIN A1C
Est. average glucose Bld gHb Est-mCnc: 120 mg/dL
Hgb A1c MFr Bld: 5.8 % — ABNORMAL HIGH (ref 4.8–5.6)

## 2023-12-03 ENCOUNTER — Other Ambulatory Visit (HOSPITAL_BASED_OUTPATIENT_CLINIC_OR_DEPARTMENT_OTHER): Payer: Self-pay | Admitting: Obstetrics & Gynecology

## 2023-12-03 DIAGNOSIS — Z0000 Encounter for general adult medical examination without abnormal findings: Secondary | ICD-10-CM

## 2023-12-12 ENCOUNTER — Other Ambulatory Visit (HOSPITAL_BASED_OUTPATIENT_CLINIC_OR_DEPARTMENT_OTHER): Payer: Self-pay | Admitting: Obstetrics & Gynecology

## 2023-12-12 DIAGNOSIS — H833X3 Noise effects on inner ear, bilateral: Secondary | ICD-10-CM

## 2023-12-12 DIAGNOSIS — Z1231 Encounter for screening mammogram for malignant neoplasm of breast: Secondary | ICD-10-CM

## 2023-12-12 DIAGNOSIS — Z0000 Encounter for general adult medical examination without abnormal findings: Secondary | ICD-10-CM

## 2023-12-17 ENCOUNTER — Telehealth (HOSPITAL_BASED_OUTPATIENT_CLINIC_OR_DEPARTMENT_OTHER): Payer: Self-pay

## 2023-12-17 ENCOUNTER — Encounter (HOSPITAL_BASED_OUTPATIENT_CLINIC_OR_DEPARTMENT_OTHER): Payer: Self-pay | Admitting: Obstetrics & Gynecology

## 2023-12-17 ENCOUNTER — Encounter (HOSPITAL_BASED_OUTPATIENT_CLINIC_OR_DEPARTMENT_OTHER): Payer: Self-pay

## 2023-12-17 NOTE — Telephone Encounter (Signed)
 Spoke with patient. Advised  Referral updated in EPIC to be directed to [facility] and [facility]. Referral also faxed to [facility] and Communication Center at [phone] with confirmation. Patient has been notified via mychart as well. Patient verbalizes understanding and will contact [facility] for scheduling.

## 2023-12-17 NOTE — Progress Notes (Signed)
 Referral updated in EPIC to be directed to [facility] and [facility]. Referral also faxed to [facility] and Communication Center at [phone] with confirmation. Patient has been notified via mychart.

## 2023-12-25 LAB — SPECIMEN STATUS REPORT

## 2023-12-25 LAB — TSH: TSH: 2.09 u[IU]/mL (ref 0.450–4.500)

## 2024-02-18 ENCOUNTER — Other Ambulatory Visit (HOSPITAL_BASED_OUTPATIENT_CLINIC_OR_DEPARTMENT_OTHER): Payer: Self-pay | Admitting: Cardiology

## 2024-03-11 ENCOUNTER — Encounter (HOSPITAL_BASED_OUTPATIENT_CLINIC_OR_DEPARTMENT_OTHER): Payer: Self-pay

## 2024-03-15 ENCOUNTER — Other Ambulatory Visit (HOSPITAL_BASED_OUTPATIENT_CLINIC_OR_DEPARTMENT_OTHER): Payer: Self-pay | Admitting: Cardiology

## 2024-05-25 ENCOUNTER — Encounter (HOSPITAL_BASED_OUTPATIENT_CLINIC_OR_DEPARTMENT_OTHER): Payer: Self-pay | Admitting: Cardiology

## 2024-05-25 ENCOUNTER — Ambulatory Visit (INDEPENDENT_AMBULATORY_CARE_PROVIDER_SITE_OTHER): Admitting: Cardiology

## 2024-05-25 VITALS — BP 110/68 | HR 68 | Ht 62.0 in | Wt 131.0 lb

## 2024-05-25 DIAGNOSIS — Z8249 Family history of ischemic heart disease and other diseases of the circulatory system: Secondary | ICD-10-CM | POA: Diagnosis not present

## 2024-05-25 DIAGNOSIS — E7800 Pure hypercholesterolemia, unspecified: Secondary | ICD-10-CM | POA: Diagnosis not present

## 2024-05-25 DIAGNOSIS — Z7189 Other specified counseling: Secondary | ICD-10-CM

## 2024-05-25 DIAGNOSIS — I2510 Atherosclerotic heart disease of native coronary artery without angina pectoris: Secondary | ICD-10-CM

## 2024-05-25 MED ORDER — ROSUVASTATIN CALCIUM 20 MG PO TABS: 20.0000 mg | ORAL_TABLET | Freq: Every day | ORAL | 3 refills | Status: AC

## 2024-05-25 NOTE — Patient Instructions (Signed)
 Medication Instructions:  Start rosuvastatin  20 mg - take one tablet daily  *If you need a refill on your cardiac medications before your next appointment, please call your pharmacy*  Lab Work: Lipids/liver  Testing/Procedures: none  Follow-Up: At [facility] you and your health needs are our priority.  As part of our continuing mission to provide you with exceptional heart care, our providers are all part of one team.  This team includes your primary Cardiologist (physician) and Advanced Practice Providers or APPs (Physician Assistants and Nurse Practitioners) who all work together to provide you with the care you need, when you need it.  Your next appointment:   2 month(s)  Provider:   Shelda Bruckner, MD

## 2024-05-25 NOTE — Progress Notes (Signed)
 " Cardiology Office Note:  .   Date:  05/25/2024  ID:  [name], DOB October 14, 1965, MRN [id number] PCP: Cleotilde Ronal RAMAN, MD  [facility] Providers Cardiologist:  Shelda Bruckner, MD {  History of Present Illness: .   Debra Caldwell is a 59 y.o. female with PMH hyperlipidemia, anxiety, depression. I met her initially 11/27/2021 and she has not been seen in cardiology since that time.   Pertinent CV history: labs 10/22/21 LDL 183, HDL 63, triglycerides 136. Healthy diet, good exercise, no history of obesity. Family history: Her maternal grandfather died at 42 yo due to second or third heart attack. Her maternal uncle died at 81 yo due to heart attack. Her grandmother had CABG x5, died in her 70's. Her mother had a stent placed about 10-12 years ago. She has 4 brothers, all on cholesterol medication, no heart attacks. Her maternal aunt had atrial fibrillation. No known heart disease on her paternal side.   Ca score 12/25/21 was 10.5 (80th %ile). Started on aspirin  and rosuvastatin .  Today: Overall doing well. Reviewed her lipids from 11/2023, excellent. She is pescaterian, excellent diet. Remains active, walks regularly, was swimming until around the holidays. Goes to gym, working to [employer] more regularly.   Has rare flutter in her chest, brief, nonlimiting.   Husband had MI shortly after we met in 2023. Both of her brothers have had very high calcium  scores and then were found to have significant CAD. Discussed lpa today.  ROS: Denies chest pain, shortness of breath at rest or with normal exertion. No PND, orthopnea, LE edema or unexpected weight gain. No syncope. ROS otherwise negative except as noted.   Studies Reviewed: SABRA    EKG:  EKG Interpretation Date/Time:  Tuesday May 25 2024 16:56:25 EST Ventricular Rate:  68 PR Interval:  110 QRS Duration:  88 QT Interval:  390 QTC Calculation: 414 R Axis:   68  Text Interpretation: Normal sinus rhythm  Confirmed by Bruckner Shelda [phone]) on 05/25/2024 5:13:44 PM    Physical Exam:   VS:  BP 110/68 (BP Location: Right Arm, Patient Position: Sitting, Cuff Size: Normal)   Pulse 68   Ht 5' 2 (1.575 m)   Wt 131 lb (59.4 kg)   LMP 04/05/2012   SpO2 98%   BMI 23.96 kg/m    Wt Readings from Last 3 Encounters:  05/25/24 131 lb (59.4 kg)  12/01/23 129 lb (58.5 kg)  11/28/22 126 lb 3.2 oz (57.2 kg)    GEN: Well nourished, well developed in no acute distress HEENT: Normal, moist mucous membranes NECK: No JVD CARDIAC: regular rhythm, normal S1 and S2, no rubs or gallops. No murmur. VASCULAR: Radial and DP pulses 2+ bilaterally. No carotid bruits RESPIRATORY:  Clear to auscultation without rales, wheezing or rhonchi  ABDOMEN: Soft, non-tender, non-distended MUSCULOSKELETAL:  Ambulates independently SKIN: Warm and dry, no edema NEUROLOGIC:  Alert and oriented x 3. No focal neuro deficits noted. PSYCHIATRIC:  Normal affect    ASSESSMENT AND PLAN: .    Hypercholesterolemia Family history of heart disease Nonobstructive CAD by calcium  score -on aspirin  81 mg daily, rosuvastatin  20 mg daily, tolerating without issues. -LDL goal <70, most recent 63 12/01/23 -discussed lpa today, she is amenable. Discussed family screening if elevated  CV risk counseling and prevention -recommend heart healthy/Mediterranean diet, with whole grains, fruits, vegetable, fish, lean meats, nuts, and olive oil. Limit salt. -recommend moderate walking, 3-5 times/week for 30-50 minutes each session. Aim for  at least 150 minutes/week. Goal should be pace of 3 miles/hours, or walking 1.5 miles in 30 minutes -recommend avoidance of tobacco products. Avoid excess alcohol.  Dispo: 2 years or sooner as needed  Signed, Shelda Bruckner, MD   Shelda Bruckner, MD, PhD, [facility]  [facility]  [place]  [facility] [address]  [address] [place], [place] [address] [phone]   "

## 2024-05-26 ENCOUNTER — Encounter (HOSPITAL_BASED_OUTPATIENT_CLINIC_OR_DEPARTMENT_OTHER): Payer: Self-pay

## 2024-11-29 ENCOUNTER — Ambulatory Visit (HOSPITAL_BASED_OUTPATIENT_CLINIC_OR_DEPARTMENT_OTHER): Payer: BC Managed Care – PPO | Admitting: Obstetrics & Gynecology

## 2024-12-03 ENCOUNTER — Ambulatory Visit (HOSPITAL_BASED_OUTPATIENT_CLINIC_OR_DEPARTMENT_OTHER): Admitting: Obstetrics & Gynecology
